# Patient Record
Sex: Female | Born: 2000 | Race: White | Hispanic: No | Smoking: Former smoker
Health system: Southern US, Community
[De-identification: ages and names within clinical notes are randomized; demographics above are authoritative.]

## PROBLEM LIST (undated history)

## (undated) DIAGNOSIS — F4310 Post-traumatic stress disorder, unspecified: Secondary | ICD-10-CM

## (undated) DIAGNOSIS — F419 Anxiety disorder, unspecified: Secondary | ICD-10-CM

## (undated) DIAGNOSIS — T1491XA Suicide attempt, initial encounter: Secondary | ICD-10-CM

## (undated) DIAGNOSIS — D509 Iron deficiency anemia, unspecified: Secondary | ICD-10-CM

## (undated) DIAGNOSIS — F4010 Social phobia, unspecified: Secondary | ICD-10-CM

## (undated) DIAGNOSIS — F509 Eating disorder, unspecified: Secondary | ICD-10-CM

## (undated) DIAGNOSIS — F32A Depression, unspecified: Secondary | ICD-10-CM

## (undated) DIAGNOSIS — K219 Gastro-esophageal reflux disease without esophagitis: Secondary | ICD-10-CM

## (undated) DIAGNOSIS — F609 Personality disorder, unspecified: Secondary | ICD-10-CM

## (undated) DIAGNOSIS — F319 Bipolar disorder, unspecified: Secondary | ICD-10-CM

## (undated) HISTORY — DX: Depression, unspecified: F32.A

---
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
## 2019-01-22 ENCOUNTER — Other Ambulatory Visit: Payer: Self-pay

## 2019-01-22 ENCOUNTER — Encounter: Payer: Self-pay | Admitting: Emergency Medicine

## 2019-01-22 ENCOUNTER — Emergency Department
Admission: EM | Admit: 2019-01-22 | Discharge: 2019-01-22 | Disposition: A | Discharge status: Home / Self Care | Payer: Self-pay | Attending: Emergency Medicine | Admitting: Emergency Medicine

## 2019-01-22 DIAGNOSIS — R509 Fever, unspecified: Secondary | ICD-10-CM | POA: Insufficient documentation

## 2019-01-22 DIAGNOSIS — Z5321 Procedure and treatment not carried out due to patient leaving prior to being seen by health care provider: Secondary | ICD-10-CM | POA: Insufficient documentation

## 2019-01-22 LAB — URINALYSIS, COMPLETE (UACMP) WITH MICROSCOPIC
Bilirubin Urine: NEGATIVE
Glucose, UA: NEGATIVE mg/dL
Ketones, ur: NEGATIVE mg/dL
Nitrite: POSITIVE — AB
Protein, ur: 100 mg/dL — AB
Specific Gravity, Urine: 1.015 (ref 1.005–1.030)
WBC, UA: >50 WBC/hpf — ABNORMAL HIGH (ref 0–5)
pH: 8 (ref 5.0–8.0)

## 2019-01-22 LAB — CBC
HCT: 40 % (ref 36.0–46.0)
Hemoglobin: 13.4 g/dL (ref 12.0–15.0)
MCH: 29.8 pg (ref 26.0–34.0)
MCHC: 33.5 g/dL (ref 30.0–36.0)
MCV: 89.1 fL (ref 80.0–100.0)
Platelets: 172 10*3/uL (ref 150–400)
RBC: 4.49 MIL/uL (ref 3.87–5.11)
RDW: 11.9 % (ref 11.5–15.5)
WBC: 10.5 10*3/uL (ref 4.0–10.5)
nRBC: 0 % (ref 0.0–0.2)

## 2019-01-22 LAB — BASIC METABOLIC PANEL
Anion gap: 8 (ref 5–15)
BUN: 9 mg/dL (ref 6–20)
CO2: 22 mmol/L (ref 22–32)
Calcium: 9.4 mg/dL (ref 8.9–10.3)
Chloride: 105 mmol/L (ref 98–111)
Creatinine, Ser: 0.71 mg/dL (ref 0.44–1.00)
GFR calc Af Amer: >60 mL/min (ref >60)
GFR calc non Af Amer: >60 mL/min (ref >60)
Glucose, Bld: 103 mg/dL — ABNORMAL HIGH (ref 70–99)
Potassium: 3.4 mmol/L — ABNORMAL LOW (ref 3.5–5.1)
Sodium: 135 mmol/L (ref 135–145)

## 2019-01-22 LAB — POCT PREGNANCY, URINE: Preg Test, Ur: NEGATIVE

## 2019-01-22 NOTE — ED Notes (Signed)
Called for patient to re-check vitals.  No response.

## 2019-01-22 NOTE — ED Triage Notes (Signed)
Patient presents to the ED with right sided flank pain that began yesterday.  Patient reports vomiting x 4 today.  Denies diarrhea.  Patient also reports fever of 101 this morning.

## 2019-01-22 NOTE — ED Notes (Signed)
Patient called to re-check.  No response.  Attempted to call patient's cell phone.  The person who answered stated patient no longer lives at residence.

## 2019-01-25 ENCOUNTER — Telehealth: Payer: Self-pay | Admitting: Emergency Medicine

## 2019-01-25 NOTE — Telephone Encounter (Signed)
Called patient due to lwot to inquire about condition and follow up plans.  Called home number--[facility] --per triage note-pt does not live there.  Called parents number and they said they have no contact with patient.

## 2019-01-29 ENCOUNTER — Emergency Department
Admission: EM | Admit: 2019-01-29 | Discharge: 2019-01-29 | Disposition: A | Discharge status: Home / Self Care | Payer: Self-pay | Attending: Student in an Organized Health Care Education/Training Program | Admitting: Student in an Organized Health Care Education/Training Program

## 2019-01-29 ENCOUNTER — Other Ambulatory Visit: Payer: Self-pay

## 2019-01-29 ENCOUNTER — Encounter: Payer: Self-pay | Admitting: Intensive Care

## 2019-01-29 DIAGNOSIS — F17210 Nicotine dependence, cigarettes, uncomplicated: Secondary | ICD-10-CM | POA: Insufficient documentation

## 2019-01-29 DIAGNOSIS — X58XXXA Exposure to other specified factors, initial encounter: Secondary | ICD-10-CM | POA: Insufficient documentation

## 2019-01-29 DIAGNOSIS — Z1881 Retained glass fragments: Secondary | ICD-10-CM | POA: Insufficient documentation

## 2019-01-29 DIAGNOSIS — T148XXA Other injury of unspecified body region, initial encounter: Secondary | ICD-10-CM

## 2019-01-29 DIAGNOSIS — S80851A Superficial foreign body, right lower leg, initial encounter: Secondary | ICD-10-CM | POA: Insufficient documentation

## 2019-01-29 DIAGNOSIS — Y929 Unspecified place or not applicable: Secondary | ICD-10-CM | POA: Insufficient documentation

## 2019-01-29 DIAGNOSIS — Y999 Unspecified external cause status: Secondary | ICD-10-CM | POA: Insufficient documentation

## 2019-01-29 DIAGNOSIS — Y998 Other external cause status: Secondary | ICD-10-CM | POA: Insufficient documentation

## 2019-01-29 DIAGNOSIS — Z79899 Other long term (current) drug therapy: Secondary | ICD-10-CM | POA: Insufficient documentation

## 2019-01-29 DIAGNOSIS — Y939 Activity, unspecified: Secondary | ICD-10-CM | POA: Insufficient documentation

## 2019-01-29 MED ORDER — BACITRACIN-NEOMYCIN-POLYMYXIN 400-5-5000 EX OINT
TOPICAL_OINTMENT | CUTANEOUS | Status: AC
  Administered 2019-01-29: 1 via TOPICAL
  Filled 2019-01-29: qty 1

## 2019-01-29 MED ORDER — LIDOCAINE HCL (PF) 1 % IJ SOLN
5.0000 mL | Freq: Once | INTRAMUSCULAR | Status: DC
  Filled 2019-01-29: qty 5

## 2019-01-29 MED ORDER — BACITRACIN-NEOMYCIN-POLYMYXIN OINTMENT TUBE
TOPICAL_OINTMENT | Freq: Once | CUTANEOUS | Status: AC
  Administered 2019-01-29: 1 via TOPICAL
  Filled 2019-01-29: qty 14.17

## 2019-01-29 NOTE — Discharge Instructions (Signed)
Do not get the sutured area wet for 24 hours. After 24 hours, shower/bathe as usual and pat the area dry. Change the bandage 2 times per day and apply antibiotic ointment. Leave open to air when at no risk of getting the area dirty, but cover at night before bed. See your PCP or go to Urgent Care in 7 days for suture removal or sooner for signs or concern of infection.  

## 2019-01-29 NOTE — ED Triage Notes (Signed)
Patient presents with having glass in her right calf. She reports it has been in there for two years and now is moving and trying to poke through skin. She tried removing at home but was unsuccessful

## 2019-01-31 NOTE — ED Provider Notes (Signed)
[facility] Emergency Department Provider Note  ____________________________________________  Time seen: Approximately 8:00 PM  I have reviewed the triage vital signs and the nursing notes.   HISTORY  Chief Complaint Foreign Body in Skin (right calf)   HPI Jacqueline Whitehead is a 18 y.o. female who presents to the emergency department for evaluation of glass foreign body in the right calf that has been present for the past 2 years. She states that over the last several days, it has moved up toward the surface of the skin. She has attempted to remove it at home without success. Glass was from a broken window.   Past Medical History:  Diagnosis Date  . Anxiety   . Bipolar 1 disorder (HCC)   . Eating disorder   . Personality disorder (HCC)   . PTSD (post-traumatic stress disorder)   . Suicide attempt [facility](HCC)     Patient Active Problem List   Diagnosis Date Noted  . 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 Suicidal ideation 06/30/2017  . MDD (major depressive disorder) 06/30/2017    History reviewed. No pertinent surgical history.  Prior to Admission medications   Medication Sig Start Date End Date Taking? Authorizing Provider  cetirizine (ZYRTEC) 10 MG tablet Take 10 mg by mouth at bedtime.    [provider]  Cholecalciferol (VITAMIN D) 2000 units tablet Take 2,000 Units by mouth daily.    [provider]  clindamycin-benzoyl peroxide (BENZACLIN) gel Apply 1 application topically at bedtime.    [provider]  feeding supplement, ENSURE ENLIVE, (ENSURE ENLIVE) LIQD Take 237 mLs by mouth 2 (two) times daily between meals. 07/02/17   Denzil Magnusonhomas, [place]ash[place], NP  haloperidol (HALDOL) 5 MG tablet Take 5 mg by mouth every 4 (four) hours as needed for agitation.    [provider]  haloperidol lactate (HALDOL) 5 MG/ML injection Inject 5 mg into the muscle every 4 (four) hours as needed  (for agitation).    [provider]  lamoTRIgine (LAMICTAL) 100 MG tablet Take 100 mg by mouth at bedtime.    [provider]  lithium carbonate 150 MG capsule Take 3 capsules (450 mg total) by mouth 2 (two) times daily with a meal. 07/01/17   Denzil Magnusonhomas, [place]ash[place], NP  LORazepam (ATIVAN) 2 MG tablet Take 2 mg by mouth every 4 (four) hours as needed for anxiety.    [provider]  LORazepam (ATIVAN) 2 MG/ML injection Inject 2 mg into the vein every 4 (four) hours as needed for anxiety.    [provider]  lurasidone (LATUDA) 80 MG TABS tablet Take 80 mg by mouth at bedtime.    [provider]  methylphenidate 36 MG PO CR tablet Take 36 mg by mouth daily.    [provider]  mirtazapine (REMERON) 30 MG tablet Take 30 mg by mouth at bedtime.    [provider]  polyethylene glycol (MIRALAX / GLYCOLAX) packet Take 17 g by mouth daily as needed for mild constipation.    [provider]  prazosin (MINIPRESS) 2 MG capsule Take 1 capsule (2 mg total) by mouth at bedtime. 07/01/17   Denzil Magnusonhomas, Lash[place], NP  topiramate (TOPAMAX) 50 MG tablet Take 50 mg by mouth 2 (two) times daily.    [provider]  vitamin C (ASCORBIC ACID) 500 MG tablet Take 500 mg by mouth daily.    [provider]    Allergies Kiwi extract; Bactrim [sulfamethoxazole-trimethoprim]; Peach [prunus persica]; and  Adhesive [tape]  History reviewed. No pertinent family history.  Social History Social History   Tobacco Use  . Smoking status: Current Every Day Smoker    Types: Cigarettes  . Smokeless tobacco: Never Used  Substance Use Topics  . Alcohol use: Yes  . Drug use: Yes    Types: Marijuana    Review of Systems  Constitutional: Negative for fever. Respiratory: Negative for cough or shortness of breath.  Musculoskeletal: Negative for myalgias Skin: Positive for foreign body. Neurological: Negative for numbness or  paresthesias. ____________________________________________   PHYSICAL EXAM:  VITAL SIGNS: ED Triage Vitals [01/29/19 1842]  Enc Vitals Group     BP (!) 131/91     Pulse Rate 85     Resp 16     Temp 98.4 F (36.9 C)     Temp Source Oral     SpO2 97 %     Weight 155 lb (70.3 kg)     Height 5\' 4"  (1.626 m)     Head Circumference      Peak Flow      Pain Score 8     Pain Loc      Pain Edu?      Excl. in GC?      Constitutional: Well appearing. Eyes: Conjunctivae are clear without discharge or drainage. Nose: No rhinorrhea noted. Mouth/Throat: Airway is patent.  Neck: No stridor. Unrestricted range of motion observed. Cardiovascular: Capillary refill is <3 seconds.  Respiratory: Respirations are even and unlabored.. Musculoskeletal: Unrestricted range of motion observed. Neurologic: Awake, alert, and oriented x 4.  Skin:  Palpable foreign body in the medial aspect of the right calf with abrasion overlying the area.  ____________________________________________   LABS (all labs ordered are listed, but only abnormal results are displayed)  Labs Reviewed - No data to display ____________________________________________  EKG  Not indicated. ____________________________________________  RADIOLOGY  Bedside US performed by me confirms small foreign body. ____________________________________________   PROCEDURES  .Foreign Body Removal Date/Time: 01/31/2019 7:26 AM Performed by: Chinita Pester, FNP Authorized by: Chinita Pester, FNP  Consent: Verbal consent obtained. Consent given by: patient Patient understanding: patient states understanding of the procedure being performed Imaging studies: imaging studies available Patient identity confirmed: verbally with patient Body area: skin General location: lower extremity Location details: right lower leg Anesthesia: local infiltration  Anesthesia: Local Anesthetic: lidocaine 1% without epinephrine Anesthetic  total: 5 mL Localization method: ultrasound and probed Removal mechanism: forceps Dressing: antibiotic ointment and dressing applied Depth: subcutaneous Complexity: simple 1 objects recovered. Objects recovered: glass Post-procedure assessment: foreign body removed Patient tolerance: Patient tolerated the procedure well with no immediate complications Comments: After cleansing skin with Betadine, 1cm incision made in the area under sterile technique in order to remove foreign body.  (2) 5-0 Nylon simple interrupted sutures were inserted post removal.    ____________________________________________   INITIAL IMPRESSION / ASSESSMENT AND PLAN / ED COURSE  Jacqueline Whitehead is a 18 y.o. female who presents to the emergency department for treatment and evaluation of foreign body in the right calf. See above for procedure details. She is to go to urgent care for suture removal in 7-10 days. Wound care instructions given. She is to see PCP, urgent care, or return here for symptoms of concern.  Medications  neomycin-bacitracin-polymyxin (NEOSPORIN) ointment (1 application Topical Given 01/29/19 2131)     Pertinent labs & imaging results that were available during my care of the patient were reviewed by me and considered in  my medical decision making (see chart for details).  ____________________________________________   FINAL CLINICAL IMPRESSION(S) / ED DIAGNOSES  Final diagnoses:  Foreign body in skin    ED Discharge Orders    None       Note:  This document was prepared using Dragon voice recognition software and may include unintentional dictation errors.   Chinita Pester, FNP 01/31/19 Jesusita Oka    Willy Eddy, MD 02/03/19 [phone]

## 2019-02-19 ENCOUNTER — Encounter: Payer: Self-pay | Admitting: Emergency Medicine

## 2019-02-19 ENCOUNTER — Emergency Department
Admission: EM | Admit: 2019-02-19 | Discharge: 2019-02-19 | Disposition: A | Discharge status: Home / Self Care | Payer: Self-pay | Attending: Emergency Medicine | Admitting: Emergency Medicine

## 2019-02-19 ENCOUNTER — Emergency Department: Payer: Self-pay

## 2019-02-19 ENCOUNTER — Other Ambulatory Visit: Payer: Self-pay

## 2019-02-19 DIAGNOSIS — F17210 Nicotine dependence, cigarettes, uncomplicated: Secondary | ICD-10-CM | POA: Insufficient documentation

## 2019-02-19 DIAGNOSIS — Z79899 Other long term (current) drug therapy: Secondary | ICD-10-CM | POA: Insufficient documentation

## 2019-02-19 DIAGNOSIS — N12 Tubulo-interstitial nephritis, not specified as acute or chronic: Secondary | ICD-10-CM | POA: Insufficient documentation

## 2019-02-19 LAB — COMPREHENSIVE METABOLIC PANEL
ALT: 10 U/L (ref 0–44)
AST: 13 U/L — ABNORMAL LOW (ref 15–41)
Albumin: 3.9 g/dL (ref 3.5–5.0)
Alkaline Phosphatase: 51 U/L (ref 38–126)
Anion gap: 9 (ref 5–15)
BUN: 5 mg/dL — ABNORMAL LOW (ref 6–20)
CO2: 23 mmol/L (ref 22–32)
Calcium: 9.2 mg/dL (ref 8.9–10.3)
Chloride: 102 mmol/L (ref 98–111)
Creatinine, Ser: 0.78 mg/dL (ref 0.44–1.00)
GFR calc Af Amer: >60 mL/min (ref >60)
GFR calc non Af Amer: >60 mL/min (ref >60)
Glucose, Bld: 101 mg/dL — ABNORMAL HIGH (ref 70–99)
Potassium: 3.7 mmol/L (ref 3.5–5.1)
Sodium: 134 mmol/L — ABNORMAL LOW (ref 135–145)
Total Bilirubin: 1.1 mg/dL (ref 0.3–1.2)
Total Protein: 8 g/dL (ref 6.5–8.1)

## 2019-02-19 LAB — CBC WITH DIFFERENTIAL/PLATELET
Abs Immature Granulocytes: 0.04 10*3/uL (ref 0.00–0.07)
Basophils Absolute: 0 10*3/uL (ref 0.0–0.1)
Basophils Relative: 0 %
Eosinophils Absolute: 0 10*3/uL (ref 0.0–0.5)
Eosinophils Relative: 0 %
HCT: 33.8 % — ABNORMAL LOW (ref 36.0–46.0)
Hemoglobin: 11.3 g/dL — ABNORMAL LOW (ref 12.0–15.0)
Immature Granulocytes: 1 %
Lymphocytes Relative: 15 %
Lymphs Abs: 1.3 10*3/uL (ref 0.7–4.0)
MCH: 28.7 pg (ref 26.0–34.0)
MCHC: 33.4 g/dL (ref 30.0–36.0)
MCV: 85.8 fL (ref 80.0–100.0)
Monocytes Absolute: 0.7 10*3/uL (ref 0.1–1.0)
Monocytes Relative: 8 %
Neutro Abs: 6.4 10*3/uL (ref 1.7–7.7)
Neutrophils Relative %: 76 %
Platelets: 215 10*3/uL (ref 150–400)
RBC: 3.94 MIL/uL (ref 3.87–5.11)
RDW: 12.4 % (ref 11.5–15.5)
WBC: 8.5 10*3/uL (ref 4.0–10.5)
nRBC: 0 % (ref 0.0–0.2)

## 2019-02-19 LAB — URINALYSIS, COMPLETE (UACMP) WITH MICROSCOPIC
Bilirubin Urine: NEGATIVE
Glucose, UA: NEGATIVE mg/dL
Ketones, ur: NEGATIVE mg/dL
Leukocytes,Ua: NEGATIVE
Nitrite: NEGATIVE
Protein, ur: NEGATIVE mg/dL
Specific Gravity, Urine: 1.012 (ref 1.005–1.030)
pH: 8 (ref 5.0–8.0)

## 2019-02-19 LAB — LIPASE, BLOOD: Lipase: 25 U/L (ref 11–51)

## 2019-02-19 LAB — LACTIC ACID, PLASMA: Lactic Acid, Venous: 0.8 mmol/L (ref 0.5–1.9)

## 2019-02-19 LAB — POCT PREGNANCY, URINE: Preg Test, Ur: NEGATIVE

## 2019-02-19 MED ORDER — CEPHALEXIN 500 MG PO CAPS: 500.0000 mg | ORAL_CAPSULE | Freq: Four times a day (QID) | ORAL | 0 refills | Status: AC

## 2019-02-19 MED ORDER — ONDANSETRON HCL 4 MG/2ML IJ SOLN
4.0000 mg | Freq: Once | INTRAMUSCULAR | Status: AC
  Administered 2019-02-19: 12:00:00 4 mg via INTRAVENOUS
  Filled 2019-02-19: qty 2

## 2019-02-19 MED ORDER — SODIUM CHLORIDE 0.9 % IV SOLN
2.0000 g | Freq: Once | INTRAVENOUS | Status: AC
  Administered 2019-02-19: 2 g via INTRAVENOUS
  Filled 2019-02-19 (×2): qty 20

## 2019-02-19 MED ORDER — MORPHINE SULFATE (PF) 4 MG/ML IV SOLN
4.0000 mg | Freq: Once | INTRAVENOUS | Status: AC
  Administered 2019-02-19: 12:00:00 4 mg via INTRAVENOUS
  Filled 2019-02-19: qty 1

## 2019-02-19 MED ORDER — ACETAMINOPHEN 500 MG PO TABS
1000.0000 mg | ORAL_TABLET | Freq: Once | ORAL | Status: AC
  Administered 2019-02-19: 12:00:00 1000 mg via ORAL
  Filled 2019-02-19: qty 2

## 2019-02-19 MED ORDER — PROMETHAZINE HCL 25 MG PO TABS: 25.0000 mg | ORAL_TABLET | Freq: Four times a day (QID) | ORAL | 0 refills | Status: DC | PRN

## 2019-02-19 MED ORDER — KETOROLAC TROMETHAMINE 30 MG/ML IJ SOLN
15.0000 mg | Freq: Once | INTRAMUSCULAR | Status: AC
  Administered 2019-02-19: 15 mg via INTRAVENOUS

## 2019-02-19 MED ORDER — SODIUM CHLORIDE 0.9 % IV BOLUS
1000.0000 mL | Freq: Once | INTRAVENOUS | Status: AC
  Administered 2019-02-19: 12:00:00 1000 mL via INTRAVENOUS

## 2019-02-19 MED ORDER — IOHEXOL 300 MG/ML  SOLN
100.0000 mL | Freq: Once | INTRAMUSCULAR | Status: AC | PRN
  Administered 2019-02-19: 100 mL via INTRAVENOUS
  Filled 2019-02-19: qty 100

## 2019-02-19 MED ORDER — KETOROLAC TROMETHAMINE 15 MG/ML IJ SOLN
15.0000 mg | Freq: Once | INTRAMUSCULAR | Status: DC
  Filled 2019-02-19: qty 1

## 2019-02-19 NOTE — ED Triage Notes (Signed)
Patient presents to the ED with right flank pain x 1 month.  Patient states flank pain has been constant for the past 2 weeks.  Patient states she had a fever approx. 1 week ago.  Patient denies any vomiting in the past 24 hours but reports occasional vomiting.  Patient reports some discomfort with urination.

## 2019-02-19 NOTE — ED Provider Notes (Signed)
[facility] Emergency Department Provider Note  ____________________________________________   First MD Initiated Contact with Patient 02/19/19 1107     (approximate)  I have reviewed the triage vital signs and the nursing notes.   HISTORY  Chief Complaint Flank Pain    HPI Jacqueline Whitehead is a 18 y.o. female with past medical history as below here with severe right-sided abdominal pain.  The patient states her symptoms started approximately 1 month ago.  She states at that time, she developed an aching, gnawing, right flank pain.  She had some associated foul-smelling urine.  She states that the symptoms initially resolved, but over the last week, have returned.  She describes it as an aching, cramping, severe, right flank pain with significant nausea.  She has had persistent vomiting as well.  She states the pain is worse with any kind of movement or palpation.  No alleviating factors.  She strongly denies any vaginal bleeding or discharge.  She has some intermittent vaginal spotting from her Implanon, but states this is been constant since it was placed.  No diarrhea.  No constipation.   Of note, patient has a history of recurrent UTIs.  No known history of kidney stones.       Past Medical History:  Diagnosis Date  . Anxiety   . Bipolar 1 disorder ([place])   . Eating disorder   . Personality disorder ([place])   . PTSD (post-traumatic stress disorder)   . Suicide attempt [facility])     Patient Active Problem List   Diagnosis Date Noted  . 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 Suicidal ideation 06/30/2017  . MDD (major depressive disorder) 06/30/2017    History reviewed. No pertinent surgical history.  Prior to Admission medications   Medication Sig Start Date End Date Taking? Authorizing Provider  cephALEXin (KEFLEX) 500 MG capsule Take 1 capsule (500 mg total) by mouth 4 (four) times daily for 10 days.  02/19/19 03/01/19  Duffy Bruce, MD  cetirizine (ZYRTEC) 10 MG tablet Take 10 mg by mouth at bedtime.    [provider]  Cholecalciferol (VITAMIN D) 2000 units tablet Take 2,000 Units by mouth daily.    [provider]  clindamycin-benzoyl peroxide (BENZACLIN) gel Apply 1 application topically at bedtime.    [provider]  feeding supplement, ENSURE ENLIVE, (ENSURE ENLIVE) LIQD Take 237 mLs by mouth 2 (two) times daily between meals. 07/02/17   Mordecai Maes, NP  haloperidol (HALDOL) 5 MG tablet Take 5 mg by mouth every 4 (four) hours as needed for agitation.    [provider]  haloperidol lactate (HALDOL) 5 MG/ML injection Inject 5 mg into the muscle every 4 (four) hours as needed (for agitation).    [provider]  lamoTRIgine (LAMICTAL) 100 MG tablet Take 100 mg by mouth at bedtime.    [provider]  lithium carbonate 150 MG capsule Take 3 capsules (450 mg total) by mouth 2 (two) times daily with a meal. 07/01/17   Mordecai Maes, NP  LORazepam (ATIVAN) 2 MG tablet Take 2 mg by mouth every 4 (four) hours as needed for anxiety.    [provider]  LORazepam (ATIVAN) 2 MG/ML injection Inject 2 mg into the vein every 4 (four) hours as needed for anxiety.    [provider]  lurasidone (LATUDA) 80 MG TABS tablet Take 80 mg by mouth at bedtime.    [provider]  methylphenidate 36 MG PO  CR tablet Take 36 mg by mouth daily.    [provider]  mirtazapine (REMERON) 30 MG tablet Take 30 mg by mouth at bedtime.    [provider]  polyethylene glycol (MIRALAX / GLYCOLAX) packet Take 17 g by mouth daily as needed for mild constipation.    [provider]  prazosin (MINIPRESS) 2 MG capsule Take 1 capsule (2 mg total) by mouth at bedtime. 07/01/17   Denzil Magnusonhomas, [place], NP  promethazine (PHENERGAN) 25 MG tablet Take 1 tablet (25 mg total) by mouth every 6 (six) hours as needed for nausea or  vomiting. 02/19/19   Shaune PollackIsaacs, Keara Pagliarulo, MD  topiramate (TOPAMAX) 50 MG tablet Take 50 mg by mouth 2 (two) times daily.    [provider]  vitamin C (ASCORBIC ACID) 500 MG tablet Take 500 mg by mouth daily.    [provider]    Allergies Kiwi extract, Bactrim [sulfamethoxazole-trimethoprim], Peach [prunus persica], and Adhesive [tape]  No family history on file.  Social History Social History   Tobacco Use  . Smoking status: Current Every Day Smoker    Types: Cigarettes  . Smokeless tobacco: Never Used  Substance Use Topics  . Alcohol use: Yes  . Drug use: Yes    Types: Marijuana    Review of Systems Review of Systems  Constitutional: Positive for chills, fatigue and fever.  HENT: Negative for congestion and rhinorrhea.   Eyes: Negative for visual disturbance.  Respiratory: Negative for cough and shortness of breath.   Cardiovascular: Negative for chest pain.  Gastrointestinal: Positive for abdominal pain, nausea and vomiting.  Genitourinary: Positive for dysuria and frequency.  Musculoskeletal: Negative for neck pain.  Skin: Negative for rash.  Neurological: Positive for weakness.  All other systems reviewed and are negative.    ____________________________________________  PHYSICAL EXAM:  VITAL SIGNS: ED Triage Vitals  Enc Vitals Group     BP 02/19/19 1054 128/80     Pulse Rate 02/19/19 1054 (!) 114     Resp 02/19/19 1054 20     Temp 02/19/19 1054 (!) 101 F (38.3 C)     Temp Source 02/19/19 1054 Oral     SpO2 02/19/19 1054 99 %     Weight 02/19/19 1055 150 lb (68 kg)     Height 02/19/19 1055 5\' 5"  (1.651 m)     Head Circumference --      Peak Flow --      Pain Score 02/19/19 1055 9     Pain Loc --      Pain Edu? --      Excl. in GC? --     Physical Exam Vitals signs and nursing note reviewed.  Constitutional:      General: She is not in acute distress.    Appearance: She is well-developed.  HENT:     Head: Normocephalic and  atraumatic.     Mouth/Throat:     Mouth: Mucous membranes are dry.  Eyes:     Conjunctiva/sclera: Conjunctivae normal.  Neck:     Musculoskeletal: Neck supple.  Cardiovascular:     Rate and Rhythm: Regular rhythm. Tachycardia present.     Heart sounds: Normal heart sounds. No murmur. No friction rub.  Pulmonary:     Effort: Pulmonary effort is normal. No respiratory distress.     Breath sounds: Normal breath sounds. No wheezing or rales.  Abdominal:     General: Bowel sounds are normal. There is no distension.     Palpations: Abdomen  is soft.     Tenderness: There is abdominal tenderness in the right upper quadrant, right lower quadrant, periumbilical area and left lower quadrant. There is right CVA tenderness. Positive signs include McBurney's sign. Negative signs include Murphy's sign and Rovsing's sign.  Skin:    General: Skin is warm.     Capillary Refill: Capillary refill takes less than 2 seconds.  Neurological:     Mental Status: She is alert and oriented to person, place, and time.     Motor: No abnormal muscle tone.       ____________________________________________   LABS (all labs ordered are listed, but only abnormal results are displayed)  Labs Reviewed  CBC WITH DIFFERENTIAL/PLATELET - Abnormal; Notable for the following components:      Result Value   Hemoglobin 11.3 (*)    HCT 33.8 (*)    All other components within normal limits  COMPREHENSIVE METABOLIC PANEL - Abnormal; Notable for the following components:   Sodium 134 (*)    Glucose, Bld 101 (*)    BUN 5 (*)    AST 13 (*)    All other components within normal limits  URINALYSIS, COMPLETE (UACMP) WITH MICROSCOPIC - Abnormal; Notable for the following components:   Color, Urine YELLOW (*)    APPearance HAZY (*)    Hgb urine dipstick SMALL (*)    Bacteria, UA RARE (*)    All other components within normal limits  URINE CULTURE  LIPASE, BLOOD  LACTIC ACID, PLASMA  POC URINE PREG, ED  POCT  PREGNANCY, URINE    ____________________________________________  EKG: N/A ________________________________________  RADIOLOGY All imaging, including plain films, CT scans, and ultrasounds, independently reviewed by me, and interpretations confirmed via formal radiology reads.  ED MD interpretation:   -CT A/P: No apparent appendicitis or obstruction; stranding noted b/l kidneys consistent with pyelonephritis. No appreciable abscess. No apparent stone or hydro.  Official radiology report(s): Ct Abdomen Pelvis W Contrast  Result Date: 02/19/2019 CLINICAL DATA:  Right flank pain over the last month, worsening recently. Intermittent fever. EXAM: CT ABDOMEN AND PELVIS WITH CONTRAST TECHNIQUE: Multidetector CT imaging of the abdomen and pelvis was performed using the standard protocol following bolus administration of intravenous contrast. CONTRAST:  100mL OMNIPAQUE IOHEXOL 300 MG/ML  SOLN COMPARISON:  None. FINDINGS: Lower chest: Normal Hepatobiliary: Normal Pancreas: Normal Spleen: Normal Adrenals/Urinary Tract: Adrenal glands are normal. There are multiple foci of poorly functioning renal tissue on both sides, right more than left, consistent with pyelonephritis/lobar nephronia. On each side, there is a 1 cm low-density presumed represent a cyst. Early renal abscess can not be ruled out but is less likely. No hydronephrosis. No bladder abnormality. Stomach/Bowel: Normal Vascular/Lymphatic: Normal Reproductive: Normal Other: None Musculoskeletal: Normal IMPRESSION: Findings consistent with pyelonephritis/lobar nephronia. This is slightly more extensive on the right than on the left. 1 cm low-density foci in each kidney probably representing incidental cysts. No renal obstruction. No stone disease. Electronically Signed   By: Paulina FusiMark  Shogry M.D.   On: 02/19/2019 12:40    ____________________________________________  PROCEDURES   Procedure(s) performed (including Critical Care):  Procedures   ____________________________________________  INITIAL IMPRESSION / MDM / ASSESSMENT AND PLAN / ED COURSE  As part of my medical decision making, I reviewed the following data within the electronic MEDICAL RECORD NUMBER Notes from prior ED visits and [place] Controlled Substance Database      *Douglass RiversCheyenne Zarzycki was evaluated in Emergency Department on 02/19/2019 for the symptoms described in the history of present illness.  She was evaluated in the context of the global COVID-19 pandemic, which necessitated consideration that the patient might be at risk for infection with the SARS-CoV-2 virus that causes COVID-19. Institutional protocols and algorithms that pertain to the evaluation of patients at risk for COVID-19 are in a state of rapid change based on information released by regulatory bodies including the CDC and federal and state organizations. These policies and algorithms were followed during the patient's care in the ED.  Some ED evaluations and interventions may be delayed as a result of limited staffing during the pandemic.*   Clinical Course as of Feb 19 1856  Fri Feb 19, 2019  1130 18 yo F here with RLQ pain. DDx includes pyelo, UTI, appendicitis, less likely cholecystitis, infected ureteral stone. Will check CT given TTP, fever, guarding on exam with c/f appendicitis. IVF ordered.   [CI]  1130 Tylenol given  Temp(!): 101 F (38.3 C) [CI]  1131 Likely 2/2 fever, non toxic on exam  Pulse Rate(!): 114 [CI]  1609 18 yo F here with pyelo. LA normal, CBC reassuring, VS improved with tylenol, fluids. CT scan w/o complication or abscess. Rocephin, d/c.   [CI]    Clinical Course User Index [CI] Shaune PollackIsaacs, Glendon Fiser, MD    Medical Decision Making: 18 yo F with h/o recurrent UTI here with R>L abdominal pain, fever, n/v. Labs as above. Normal LA, CBC reassuring, no signs of severe sepsis. Imaging shows pyelo vs lobar nephronia, no obstruction or abscess. UCx sent, pt feels markedly improved w/ IVF and  Rocephin. VS are now normal. She is tolerating PO. Denies vaginal bleeding, discharge, or signs of PID. Will tx with keflex as she has no insurance, d/c with close outpt follow-up and good return precautions.  ____________________________________________  FINAL CLINICAL IMPRESSION(S) / ED DIAGNOSES  Final diagnoses:  Pyelonephritis     MEDICATIONS GIVEN DURING THIS VISIT:  Medications  sodium chloride 0.9 % bolus 1,000 mL (0 mLs Intravenous Stopped 02/19/19 1228)  morphine 4 MG/ML injection 4 mg (4 mg Intravenous Given 02/19/19 1153)  ondansetron (ZOFRAN) injection 4 mg (4 mg Intravenous Given 02/19/19 1153)  acetaminophen (TYLENOL) tablet 1,000 mg (1,000 mg Oral Given 02/19/19 1153)  iohexol (OMNIPAQUE) 300 MG/ML solution 100 mL (100 mLs Intravenous Contrast Given 02/19/19 1227)  cefTRIAXone (ROCEPHIN) 2 g in sodium chloride 0.9 % 100 mL IVPB (0 g Intravenous Stopped 02/19/19 1634)  ketorolac (TORADOL) 30 MG/ML injection 15 mg (15 mg Intravenous Given 02/19/19 1503)     ED Discharge Orders         Ordered    cephALEXin (KEFLEX) 500 MG capsule  4 times daily     02/19/19 1527    promethazine (PHENERGAN) 25 MG tablet  Every 6 hours PRN     02/19/19 1527           Note:  This document was prepared using Dragon voice recognition software and may include unintentional dictation errors.   Shaune PollackIsaacs, Adanely Reynoso, MD 02/19/19 [phone]

## 2019-02-19 NOTE — Discharge Instructions (Addendum)
Your workup today suggests that you have a urinary tract infection (UTI) which has spread to your kidneys.  Please take your antibiotic as prescribed and over-the-counter pain medication (Tylenol or Motrin) as needed, but no more than recommended on the label instructions.  Drink PLENTY of fluids.  Call your regular doctor to schedule the next available appointment to follow up on today's ED visit, or return immediately to the ED if your pain worsens, you have decreased urine production, develop fever, persistent vomiting, or other symptoms that concern you.  

## 2019-02-21 LAB — URINE CULTURE
Culture: 50000 — AB
Special Requests: NORMAL

## 2019-03-14 ENCOUNTER — Other Ambulatory Visit: Payer: Self-pay

## 2019-03-14 ENCOUNTER — Encounter: Payer: Self-pay | Admitting: Emergency Medicine

## 2019-03-14 ENCOUNTER — Emergency Department
Admission: EM | Admit: 2019-03-14 | Discharge: 2019-03-14 | Disposition: A | Discharge status: Home / Self Care | Payer: Self-pay | Attending: Emergency Medicine | Admitting: Emergency Medicine

## 2019-03-14 ENCOUNTER — Emergency Department: Payer: Self-pay

## 2019-03-14 DIAGNOSIS — F17210 Nicotine dependence, cigarettes, uncomplicated: Secondary | ICD-10-CM | POA: Insufficient documentation

## 2019-03-14 DIAGNOSIS — R1032 Left lower quadrant pain: Secondary | ICD-10-CM | POA: Insufficient documentation

## 2019-03-14 DIAGNOSIS — R1031 Right lower quadrant pain: Secondary | ICD-10-CM | POA: Insufficient documentation

## 2019-03-14 DIAGNOSIS — R109 Unspecified abdominal pain: Secondary | ICD-10-CM

## 2019-03-14 DIAGNOSIS — Z79899 Other long term (current) drug therapy: Secondary | ICD-10-CM | POA: Insufficient documentation

## 2019-03-14 LAB — URINALYSIS, COMPLETE (UACMP) WITH MICROSCOPIC
Bilirubin Urine: NEGATIVE
Glucose, UA: NEGATIVE mg/dL
Hgb urine dipstick: NEGATIVE
Ketones, ur: NEGATIVE mg/dL
Leukocytes,Ua: NEGATIVE
Nitrite: NEGATIVE
Protein, ur: NEGATIVE mg/dL
Specific Gravity, Urine: 1.021 (ref 1.005–1.030)
pH: 6 (ref 5.0–8.0)

## 2019-03-14 LAB — BASIC METABOLIC PANEL
Anion gap: 6 (ref 5–15)
BUN: 12 mg/dL (ref 6–20)
CO2: 26 mmol/L (ref 22–32)
Calcium: 9.5 mg/dL (ref 8.9–10.3)
Chloride: 108 mmol/L (ref 98–111)
Creatinine, Ser: 0.69 mg/dL (ref 0.44–1.00)
GFR calc Af Amer: >60 mL/min (ref >60)
GFR calc non Af Amer: >60 mL/min (ref >60)
Glucose, Bld: 91 mg/dL (ref 70–99)
Potassium: 3.5 mmol/L (ref 3.5–5.1)
Sodium: 140 mmol/L (ref 135–145)

## 2019-03-14 LAB — CBC
HCT: 40.6 % (ref 36.0–46.0)
Hemoglobin: 13.1 g/dL (ref 12.0–15.0)
MCH: 28.5 pg (ref 26.0–34.0)
MCHC: 32.3 g/dL (ref 30.0–36.0)
MCV: 88.5 fL (ref 80.0–100.0)
Platelets: 207 10*3/uL (ref 150–400)
RBC: 4.59 MIL/uL (ref 3.87–5.11)
RDW: 14.1 % (ref 11.5–15.5)
WBC: 5.2 10*3/uL (ref 4.0–10.5)
nRBC: 0 % (ref 0.0–0.2)

## 2019-03-14 LAB — URINE DRUG SCREEN, QUALITATIVE (ARMC ONLY)
Amphetamines, Ur Screen: NOT DETECTED
Barbiturates, Ur Screen: NOT DETECTED
Benzodiazepine, Ur Scrn: NOT DETECTED
Cannabinoid 50 Ng, Ur ~~LOC~~: POSITIVE — AB
Cocaine Metabolite,Ur ~~LOC~~: NOT DETECTED
MDMA (Ecstasy)Ur Screen: NOT DETECTED
Methadone Scn, Ur: NOT DETECTED
Opiate, Ur Screen: NOT DETECTED
Phencyclidine (PCP) Ur S: NOT DETECTED
Tricyclic, Ur Screen: NOT DETECTED

## 2019-03-14 LAB — PREGNANCY, URINE: Preg Test, Ur: NEGATIVE

## 2019-03-14 MED ORDER — SODIUM CHLORIDE 0.9 % IV SOLN
1.0000 g | Freq: Once | INTRAVENOUS | Status: AC
  Administered 2019-03-14: 20:00:00 1 g via INTRAVENOUS
  Filled 2019-03-14: qty 10

## 2019-03-14 MED ORDER — KETOROLAC TROMETHAMINE 30 MG/ML IJ SOLN
15.0000 mg | Freq: Once | INTRAMUSCULAR | Status: AC
  Administered 2019-03-14: 15 mg via INTRAVENOUS

## 2019-03-14 MED ORDER — KETOROLAC TROMETHAMINE 30 MG/ML IJ SOLN
INTRAMUSCULAR | Status: AC
  Filled 2019-03-14: qty 1

## 2019-03-14 MED ORDER — CEPHALEXIN 500 MG PO CAPS: 500.0000 mg | ORAL_CAPSULE | Freq: Three times a day (TID) | ORAL | 0 refills | Status: AC

## 2019-03-14 MED ORDER — ONDANSETRON HCL 4 MG PO TABS: 4.0000 mg | ORAL_TABLET | Freq: Three times a day (TID) | ORAL | 0 refills | Status: DC | PRN

## 2019-03-14 NOTE — ED Provider Notes (Addendum)
[facility] Emergency Department Provider Note  ____________________________________________   I have reviewed the triage vital signs and the nursing notes. Where available I have reviewed prior notes and, if possible and indicated, outside hospital notes.    HISTORY  Chief Complaint Flank Pain    HPI Jacqueline Whitehead is a 18 y.o. female with a history of personality disorder PTSD, bipolar, major depressive disorder, and frequent recurrent urinary tract infections who is here last month for UTI.  She states that since she was about 18 years old she said chronic recurrent pain in her side which comes and goes every few weeks.  Often's associate with urinary tract infection sometimes is just there.  Sometimes she has vomiting sometimes she does not.  She was seen here in June of this year, and had a urinary tract infection although her UA was not impressive for urine culture did grow out pansensitive E. coli, patient had a CT scan that showed questionable early pyelonephritis.  She was given a dose of Rocephin here in antibiotics were sent to a pharmacy for her but she no longer uses that pharmacy and did not get the antibiotics filled.  He states off and on since that time as is her general condition, she has had intermittent right-sided flank pain.  She had some pain over the last week or so again.  Never completely goes away but sometimes he goes up and down.  She does not had any vomiting recently although she did vomit a couple days ago and again this is not unusual for her since she was 18 years old.  No fevers.  She denies dysuria or urinary frequency.  She denies STI symptoms.  She has no abdominal pain is only flank pain.  This is her chronic presentation.  She has had a cystoscopy for this and 2011 she states.  She states that she needs a work note.  Pain is sharp, nonradiating waxing and waning, and feels like she is got another urinary tract infection.  States she is  on the Norplant on and has recurrent menstrual periods which are irregular.  She denies pregnancy.  Past Medical History:  Diagnosis Date  . Anxiety   . Bipolar 1 disorder (HCC)   . Eating disorder   . Personality disorder (HCC)   . PTSD (post-traumatic stress disorder)   . Suicide attempt [facility](HCC)     Patient Active Problem List   Diagnosis Date Noted  . 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 Suicidal ideation 06/30/2017  . MDD (major depressive disorder) 06/30/2017    History reviewed. No pertinent surgical history.  Prior to Admission medications   Medication Sig Start Date End Date Taking? Authorizing Provider  cetirizine (ZYRTEC) 10 MG tablet Take 10 mg by mouth at bedtime.    [provider]  Cholecalciferol (VITAMIN D) 2000 units tablet Take 2,000 Units by mouth daily.    [provider]  clindamycin-benzoyl peroxide (BENZACLIN) gel Apply 1 application topically at bedtime.    [provider]  feeding supplement, ENSURE ENLIVE, (ENSURE ENLIVE) LIQD Take 237 mLs by mouth 2 (two) times daily between meals. 07/02/17   Denzil Magnusonhomas, Lashunda, NP  haloperidol (HALDOL) 5 MG tablet Take 5 mg by mouth every 4 (four) hours as needed for agitation.    [provider]  haloperidol lactate (HALDOL) 5 MG/ML injection Inject 5 mg into the muscle every 4 (four) hours as needed (for agitation).    [provider]  lamoTRIgine (LAMICTAL) 100 MG tablet Take 100 mg by mouth at bedtime.    [provider]  lithium carbonate 150 MG capsule Take 3 capsules (450 mg total) by mouth 2 (two) times daily with a meal. 07/01/17   Denzil Magnusonhomas, Lashunda, NP  LORazepam (ATIVAN) 2 MG tablet Take 2 mg by mouth every 4 (four) hours as needed for anxiety.    [provider]  LORazepam (ATIVAN) 2 MG/ML injection Inject 2 mg into the vein every 4 (four) hours as needed for anxiety.    [provider]   lurasidone (LATUDA) 80 MG TABS tablet Take 80 mg by mouth at bedtime.    [provider]  methylphenidate 36 MG PO CR tablet Take 36 mg by mouth daily.    [provider]  mirtazapine (REMERON) 30 MG tablet Take 30 mg by mouth at bedtime.    [provider]  polyethylene glycol (MIRALAX / GLYCOLAX) packet Take 17 g by mouth daily as needed for mild constipation.    [provider]  prazosin (MINIPRESS) 2 MG capsule Take 1 capsule (2 mg total) by mouth at bedtime. 07/01/17   Denzil Magnusonhomas, Lashunda, NP  promethazine (PHENERGAN) 25 MG tablet Take 1 tablet (25 mg total) by mouth every 6 (six) hours as needed for nausea or vomiting. 02/19/19   Shaune PollackIsaacs, Cameron, MD  topiramate (TOPAMAX) 50 MG tablet Take 50 mg by mouth 2 (two) times daily.    [provider]  vitamin C (ASCORBIC ACID) 500 MG tablet Take 500 mg by mouth daily.    [provider]    Allergies Kiwi extract, Bactrim [sulfamethoxazole-trimethoprim], Peach [prunus persica], and Adhesive [tape]  No family history on file.  Social History Social History   Tobacco Use  . Smoking status: Current Every Day Smoker    Types: Cigarettes  . Smokeless tobacco: Never Used  Substance Use Topics  . Alcohol use: Yes  . Drug use: Yes    Types: Marijuana    Review of Systems Constitutional: No fever/chills Eyes: No visual changes. ENT: No sore throat. No stiff neck no neck pain Cardiovascular: Denies chest pain. Respiratory: Denies shortness of breath. Gastrointestinal:   See HPI, no change in stool Genitourinary: Negative for dysuria. Musculoskeletal: Negative lower extremity swelling Skin: Negative for rash. Neurological: Negative for severe headaches, focal weakness or numbness.   ____________________________________________   PHYSICAL EXAM:  VITAL SIGNS: ED Triage Vitals  Enc Vitals Group     BP 03/14/19 1724 122/84     Pulse Rate 03/14/19 1724 81     Resp 03/14/19 1724 18      Temp 03/14/19 1724 98.7 F (37.1 C)     Temp Source 03/14/19 1724 Oral     SpO2 03/14/19 1724 99 %     Weight 03/14/19 1724 150 lb (68 kg)     Height 03/14/19 1724 5\' 5"  (1.651 m)     Head Circumference --      Peak Flow --      Pain Score 03/14/19 1729 8     Pain Loc --      Pain Edu? --      Excl. in GC? --     Constitutional: Alert and oriented. Well appearing and in no acute distress. Eyes: Conjunctivae are normal Head: Atraumatic HEENT: No congestion/rhinnorhea. Mucous membranes are moist.  Oropharynx non-erythematous Neck:   Nontender with no meningismus, no masses, no stridor Cardiovascular: Normal rate, regular rhythm. Grossly normal heart sounds.  Good peripheral circulation. Respiratory: Normal respiratory effort.  No retractions. Lungs CTAB. Abdominal: Soft and nontender. No distention. No guarding no rebound Back:  There is no focal tenderness or step off.  there is no midline tenderness there are no lesions noted. there is right CVA tenderness GU: Patient declines GU exam understands limitations upon this declining Musculoskeletal: No lower extremity tenderness, no upper extremity tenderness. No joint effusions, no DVT signs strong distal pulses no edema Neurologic:  Normal speech and language. No gross focal neurologic deficits are appreciated.  Skin:  Skin is warm, dry and intact. No rash noted. Psychiatric: Mood and affect are normal. Speech and behavior are normal.  ____________________________________________   LABS (all labs ordered are listed, but only abnormal results are displayed)  Labs Reviewed  URINALYSIS, COMPLETE (UACMP) WITH MICROSCOPIC - Abnormal; Notable for the following components:      Result Value   Color, Urine YELLOW (*)    APPearance CLOUDY (*)    Bacteria, UA RARE (*)    All other components within normal limits  BASIC METABOLIC PANEL  CBC  PREGNANCY, URINE  URINE DRUG SCREEN, QUALITATIVE ([facility] ONLY)  POC URINE PREG, ED     Pertinent labs  results that were available during my care of the patient were reviewed by me and considered in my medical decision making (see chart for details). ____________________________________________  EKG  I personally interpreted any EKGs ordered by me or triage  ____________________________________________  RADIOLOGY  Pertinent labs & imaging results that were available during my care of the patient were reviewed by me and considered in my medical decision making (see chart for details). If possible, patient and/or family made aware of any abnormal findings.  No results found. ____________________________________________    PROCEDURES  Procedure(s) performed: None  Procedures  Critical Care performed: None  ____________________________________________   INITIAL IMPRESSION / ASSESSMENT AND PLAN / ED COURSE  Pertinent labs & imaging results that were available during my care of the patient were reviewed by me and considered in my medical decision making (see chart for details).  With chronic right-sided flank pain sometimes associated with urinary tract infection sometimes it just comes and goes.  Patient is in no acute distress her abdomen is completely benign do not think this represents appendicitis, her pelvic exam, she declined but with no abdominal tenderness low suspicion for PID or STI.  Patient does have a reassuring looking urine however, she does have E. coli recurrently and recurrent apparent pyelonephritis.  I am reluctant to obtain another CT scan.  I do not think that she likely has an abscess there, we are giving her IV Rocephin as she was not able to complete her antibiotic last time she was here and I will obtain a nonradiating ultrasound to further evaluate her kidneys.  I have explained to the patient she will absolutely need to follow closely with urology and take antibiotics and discharge depending on what we  find  ----------------------------------------- 10:44 PM on 03/14/2019 -----------------------------------------  She remains in no acute distress, work-up is quite reassuring ultrasound is reassuring blood work is reassuring urinalysis is reassuring, she is texting on the phone to her boyfriend.  Her pain is gone after Toradol.  She has asked me several times for a work note which we will give.  This is chronic recurrent pain.  Abdomen is benign.  She does not want a pelvic exam.  We will discharge her with close outpatient follow-up.  Given that she did have a  equivocal UA last time but what appeared to be E. coli which was pansensitive I will send her home with a prescription.  Which she is instructed follow-up with OB and urology.  Return precautions follow-up given understood.    ____________________________________________   FINAL CLINICAL IMPRESSION(S) / ED DIAGNOSES  Final diagnoses:  None      This chart was dictated using voice recognition software.  Despite best efforts to proofread,  errors can occur which can change meaning.      Schuyler Amor, MD 03/14/19 2036    Schuyler Amor, MD 03/14/19 2245

## 2019-03-14 NOTE — Discharge Instructions (Addendum)
Turn to the emergency room for any new or worrisome symptoms including increased pain change in her chronic pain fever vomiting.  Follow close with urology and OB/GYN.  Take the antibiotics until they are gone.

## 2019-03-14 NOTE — ED Triage Notes (Signed)
Pt to ED via POV for flank pain. Pt states that she was seen 3 weeks ago for same. Pt was told to come back if she started to have N/V. Pt is in NAD.

## 2019-03-14 NOTE — ED Notes (Signed)
Assessment: pt states she has had her period for three months. Pt states she is having bilateral flank pain, more concentrated on right side. Pt denies urinary incontinence, known hematuria. Pt states she has had intermittent fever this week. Pt ambulatory without difficulty, denies vomiting, nausea.

## 2019-12-04 ENCOUNTER — Other Ambulatory Visit: Payer: Self-pay

## 2019-12-04 ENCOUNTER — Emergency Department
Admission: EM | Admit: 2019-12-04 | Discharge: 2019-12-04 | Disposition: A | Discharge status: Home / Self Care | Payer: Self-pay | Attending: Emergency Medicine | Admitting: Emergency Medicine

## 2019-12-04 DIAGNOSIS — Z87891 Personal history of nicotine dependence: Secondary | ICD-10-CM | POA: Insufficient documentation

## 2019-12-04 DIAGNOSIS — N12 Tubulo-interstitial nephritis, not specified as acute or chronic: Secondary | ICD-10-CM | POA: Insufficient documentation

## 2019-12-04 DIAGNOSIS — R350 Frequency of micturition: Secondary | ICD-10-CM | POA: Insufficient documentation

## 2019-12-04 DIAGNOSIS — R309 Painful micturition, unspecified: Secondary | ICD-10-CM | POA: Insufficient documentation

## 2019-12-04 DIAGNOSIS — Z79899 Other long term (current) drug therapy: Secondary | ICD-10-CM | POA: Insufficient documentation

## 2019-12-04 DIAGNOSIS — R112 Nausea with vomiting, unspecified: Secondary | ICD-10-CM | POA: Insufficient documentation

## 2019-12-04 DIAGNOSIS — F1210 Cannabis abuse, uncomplicated: Secondary | ICD-10-CM | POA: Insufficient documentation

## 2019-12-04 LAB — URINALYSIS, COMPLETE (UACMP) WITH MICROSCOPIC
Bilirubin Urine: NEGATIVE
Glucose, UA: NEGATIVE mg/dL
Ketones, ur: 80 mg/dL — AB
Nitrite: POSITIVE — AB
Protein, ur: 100 mg/dL — AB
Specific Gravity, Urine: 1.021 (ref 1.005–1.030)
WBC, UA: >50 WBC/hpf — ABNORMAL HIGH (ref 0–5)
pH: 7 (ref 5.0–8.0)

## 2019-12-04 LAB — CBC
HCT: 36.6 % (ref 36.0–46.0)
Hemoglobin: 13.2 g/dL (ref 12.0–15.0)
MCH: 30.6 pg (ref 26.0–34.0)
MCHC: 36.1 g/dL — ABNORMAL HIGH (ref 30.0–36.0)
MCV: 84.7 fL (ref 80.0–100.0)
Platelets: 141 10*3/uL — ABNORMAL LOW (ref 150–400)
RBC: 4.32 MIL/uL (ref 3.87–5.11)
RDW: 11.8 % (ref 11.5–15.5)
WBC: 10.1 10*3/uL (ref 4.0–10.5)
nRBC: 0 % (ref 0.0–0.2)

## 2019-12-04 LAB — BASIC METABOLIC PANEL
Anion gap: 13 (ref 5–15)
BUN: 11 mg/dL (ref 6–20)
CO2: 19 mmol/L — ABNORMAL LOW (ref 22–32)
Calcium: 9.6 mg/dL (ref 8.9–10.3)
Chloride: 102 mmol/L (ref 98–111)
Creatinine, Ser: 0.74 mg/dL (ref 0.44–1.00)
GFR calc Af Amer: >60 mL/min (ref >60)
GFR calc non Af Amer: >60 mL/min (ref >60)
Glucose, Bld: 103 mg/dL — ABNORMAL HIGH (ref 70–99)
Potassium: 3.4 mmol/L — ABNORMAL LOW (ref 3.5–5.1)
Sodium: 134 mmol/L — ABNORMAL LOW (ref 135–145)

## 2019-12-04 LAB — POCT PREGNANCY, URINE: Preg Test, Ur: NEGATIVE

## 2019-12-04 MED ORDER — ONDANSETRON HCL 4 MG/2ML IJ SOLN
4.0000 mg | Freq: Once | INTRAMUSCULAR | Status: AC
  Administered 2019-12-04: 4 mg via INTRAVENOUS
  Filled 2019-12-04: qty 2

## 2019-12-04 MED ORDER — SODIUM CHLORIDE 0.9 % IV SOLN
1.0000 g | Freq: Once | INTRAVENOUS | Status: AC
  Administered 2019-12-04: 1 g via INTRAVENOUS
  Filled 2019-12-04: qty 10

## 2019-12-04 MED ORDER — LACTATED RINGERS IV BOLUS
1000.0000 mL | Freq: Once | INTRAVENOUS | Status: AC
  Administered 2019-12-04: 1000 mL via INTRAVENOUS

## 2019-12-04 MED ORDER — ONDANSETRON 4 MG PO TBDP: 4.0000 mg | ORAL_TABLET | Freq: Three times a day (TID) | ORAL | 0 refills | Status: DC | PRN

## 2019-12-04 MED ORDER — ACETAMINOPHEN 500 MG PO TABS
1000.0000 mg | ORAL_TABLET | Freq: Once | ORAL | Status: AC
  Administered 2019-12-04: 1000 mg via ORAL
  Filled 2019-12-04: qty 2

## 2019-12-04 MED ORDER — POTASSIUM CHLORIDE CRYS ER 20 MEQ PO TBCR
20.0000 meq | EXTENDED_RELEASE_TABLET | Freq: Once | ORAL | Status: AC
  Administered 2019-12-04: 20 meq via ORAL
  Filled 2019-12-04: qty 1

## 2019-12-04 MED ORDER — KETOROLAC TROMETHAMINE 30 MG/ML IJ SOLN
15.0000 mg | Freq: Once | INTRAMUSCULAR | Status: AC
  Administered 2019-12-04: 15 mg via INTRAVENOUS
  Filled 2019-12-04: qty 1

## 2019-12-04 MED ORDER — CEPHALEXIN 500 MG PO CAPS: 1000.0000 mg | ORAL_CAPSULE | Freq: Two times a day (BID) | ORAL | 0 refills | Status: AC

## 2019-12-04 NOTE — ED Notes (Signed)
Patient reminded to keep arm straight in which Rocephin is infusing.

## 2019-12-04 NOTE — ED Triage Notes (Signed)
Pt states she has R flank pain. States N&V. States known kidney cyst. Symptoms x 3-4 days. States fever as well. A&O, in wheelchair.

## 2019-12-04 NOTE — ED Provider Notes (Signed)
[facility] Emergency Department Provider Note   ____________________________________________   First MD Initiated Contact with Patient 12/04/19 1122     (approximate)  I have reviewed the triage vital signs and the nursing notes.   HISTORY  Chief Complaint Flank Pain    HPI Jacqueline Whitehead is a 19 y.o. female with past medical history of PTSD and bipolar disorder who presents to the ED complaining of flank pain.  Patient reports that she has had 3 to 4 days of pain primarily affecting her left flank, but also seems to spread to her right flank and around to her abdomen.  This is been associated with some discomfort when she urinates as well as urinary frequency, she denies any hematuria, vaginal bleeding, or vaginal discharge.  She has also felt hot at home and is concerned she might have a fever, but has not taken her temperature.  She has been feeling nauseous with multiple episodes of vomiting, but denies any diarrhea.  Symptoms feel similar to her prior UTIs and she denies any history of kidney stones, but does report a cyst on her left kidney.        Past Medical History:  Diagnosis Date  . Anxiety   . Bipolar 1 disorder (HCC)   . Eating disorder   . Personality disorder (HCC)   . PTSD (post-traumatic stress disorder)   . Suicide attempt [facility])     Patient Active Problem List   Diagnosis Date Noted  . 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 Suicidal ideation 06/30/2017  . MDD (major depressive disorder) 06/30/2017    History reviewed. No pertinent surgical history.  Prior to Admission medications   Medication Sig Start Date End Date Taking? Authorizing Provider  cephALEXin (KEFLEX) 500 MG capsule Take 2 capsules (1,000 mg total) by mouth 2 (two) times daily for 10 days. 12/04/19 12/14/19  Chesley Noon, MD  cetirizine (ZYRTEC) 10 MG tablet Take 10 mg by mouth at bedtime.    [provider]  Cholecalciferol (VITAMIN D) 2000 units tablet Take 2,000 Units by mouth daily.    [provider]  clindamycin-benzoyl peroxide (BENZACLIN) gel Apply 1 application topically at bedtime.    [provider]  feeding supplement, ENSURE ENLIVE, (ENSURE ENLIVE) LIQD Take 237 mLs by mouth 2 (two) times daily between meals. 07/02/17   Denzil Magnuson, NP  haloperidol (HALDOL) 5 MG tablet Take 5 mg by mouth every 4 (four) hours as needed for agitation.    [provider]  haloperidol lactate (HALDOL) 5 MG/ML injection Inject 5 mg into the muscle every 4 (four) hours as needed (for agitation).    [provider]  lamoTRIgine (LAMICTAL) 100 MG tablet Take 100 mg by mouth at bedtime.    [provider]  lithium carbonate 150 MG capsule Take 3 capsules (450 mg total) by mouth 2 (two) times daily with a meal. 07/01/17   Denzil Magnuson, NP  LORazepam (ATIVAN) 2 MG tablet Take 2 mg by mouth every 4 (four) hours as needed for anxiety.    [provider]  LORazepam (ATIVAN) 2 MG/ML injection Inject 2 mg into the vein every 4 (four) hours as needed for anxiety.    [provider]  lurasidone (LATUDA) 80 MG TABS tablet Take 80 mg by mouth at bedtime.    [provider]  methylphenidate 36 MG PO CR tablet Take 36 mg by mouth daily.    [provider]  mirtazapine (REMERON) 30 MG tablet Take 30 mg by mouth at bedtime.    [provider]  ondansetron (ZOFRAN ODT) 4 MG disintegrating tablet Take 1 tablet (4 mg total) by mouth every 8 (eight) hours as needed for nausea or vomiting. 12/04/19   Chesley Noon, MD  ondansetron (ZOFRAN) 4 MG tablet Take 1 tablet (4 mg total) by mouth every 8 (eight) hours as needed for nausea or vomiting. 03/14/19   Jeanmarie Plant, MD  polyethylene glycol (MIRALAX / GLYCOLAX) packet Take 17 g by mouth daily as needed for mild constipation.    [provider]  prazosin  (MINIPRESS) 2 MG capsule Take 1 capsule (2 mg total) by mouth at bedtime. 07/01/17   Denzil Magnuson, NP  promethazine (PHENERGAN) 25 MG tablet Take 1 tablet (25 mg total) by mouth every 6 (six) hours as needed for nausea or vomiting. 02/19/19   Shaune Pollack, MD  topiramate (TOPAMAX) 50 MG tablet Take 50 mg by mouth 2 (two) times daily.    [provider]  vitamin C (ASCORBIC ACID) 500 MG tablet Take 500 mg by mouth daily.    [provider]    Allergies Kiwi extract, Bactrim [sulfamethoxazole-trimethoprim], Peach [prunus persica], and Adhesive [tape]  History reviewed. No pertinent family history.  Social History Social History   Tobacco Use  . Smoking status: Former Smoker    Types: Cigarettes  . Smokeless tobacco: Never Used  Substance Use Topics  . Alcohol use: Yes  . Drug use: Yes    Types: Marijuana    Review of Systems  Constitutional: Positive for fever/chills Eyes: No visual changes. ENT: No sore throat. Cardiovascular: Denies chest pain. Respiratory: Denies shortness of breath. Gastrointestinal: Positive for flank and abdominal pain.  Positive for nausea and vomiting.  No diarrhea.  No constipation. Genitourinary: Negative for dysuria. Musculoskeletal: Negative for back pain. Skin: Negative for rash. Neurological: Negative for headaches, focal weakness or numbness.  ____________________________________________   PHYSICAL EXAM:  VITAL SIGNS: ED Triage Vitals  Enc Vitals Group     BP 12/04/19 1002 104/86     Pulse Rate 12/04/19 1002 (!) 108     Resp 12/04/19 1002 16     Temp 12/04/19 1002 99.5 F (37.5 C)     Temp Source 12/04/19 1002 Oral     SpO2 12/04/19 1002 98 %     Weight 12/04/19 1000 145 lb (65.8 kg)     Height 12/04/19 1000 5\' 4"  (1.626 m)     Head Circumference --      Peak Flow --      Pain Score 12/04/19 1000 7     Pain Loc --      Pain Edu? --      Excl. in GC? --     Constitutional: Alert and oriented. Eyes:  Conjunctivae are normal. Head: Atraumatic. Nose: No congestion/rhinnorhea. Mouth/Throat: Mucous membranes are moist. Neck: Normal ROM Cardiovascular: Normal rate, regular rhythm. Grossly normal heart sounds. Respiratory: Normal respiratory effort.  No retractions. Lungs CTAB. Gastrointestinal: Soft and nontender. No distention.  CVA tenderness noted bilaterally. Genitourinary: deferred Musculoskeletal: No lower extremity tenderness nor edema. Neurologic:  Normal speech and language. No gross focal neurologic deficits are appreciated. Skin:  Skin is warm, dry and intact. No rash noted. Psychiatric: Mood and affect are normal. Speech and behavior are normal.  ____________________________________________   LABS (all labs ordered are listed, but only abnormal results are displayed)  Labs Reviewed  URINALYSIS, COMPLETE (UACMP) WITH MICROSCOPIC -  Abnormal; Notable for the following components:      Result Value   Color, Urine AMBER (*)    APPearance HAZY (*)    Hgb urine dipstick SMALL (*)    Ketones, ur 80 (*)    Protein, ur 100 (*)    Nitrite POSITIVE (*)    Leukocytes,Ua MODERATE (*)    WBC, UA >50 (*)    Bacteria, UA MANY (*)    Non Squamous Epithelial PRESENT (*)    All other components within normal limits  BASIC METABOLIC PANEL - Abnormal; Notable for the following components:   Sodium 134 (*)    Potassium 3.4 (*)    CO2 19 (*)    Glucose, Bld 103 (*)    All other components within normal limits  CBC - Abnormal; Notable for the following components:   MCHC 36.1 (*)    Platelets 141 (*)    All other components within normal limits  URINE CULTURE  POC URINE PREG, ED  POCT PREGNANCY, URINE     PROCEDURES  Procedure(s) performed (including Critical Care):  Procedures   ____________________________________________   INITIAL IMPRESSION / ASSESSMENT AND PLAN / ED COURSE       19 year old female presents to the ED complaining of worsening left flank pain as  well as urinary symptoms and subjective fevers over the past 3 to 4 days.  Given her CVA tenderness, I am most concerned for pyelonephritis.  Urine pregnancy is negative and UA is pending, we will treat symptomatically with Toradol and Zofran, hydrate with IV fluids.  Lab work thus far is unremarkable.  I have a low suspicion for nephrolithiasis and will hold off on CT scan for now unless UA were to be unrevealing.  Patient is feeling better following Toradol, Zofran, and IV fluids.  UA is consistent with UTI, urine culture was sent and patient given initial dose of Rocephin.  We will treat with 10 days of Keflex for suspected pyelonephritis and patient given referral to establish care with PCP.  She was counseled to return to the ED for new worsening symptoms, patient agrees with plan.      ____________________________________________   FINAL CLINICAL IMPRESSION(S) / ED DIAGNOSES  Final diagnoses:  Pyelonephritis     ED Discharge Orders         Ordered    cephALEXin (KEFLEX) 500 MG capsule  2 times daily     12/04/19 1426    ondansetron (ZOFRAN ODT) 4 MG disintegrating tablet  Every 8 hours PRN     12/04/19 1426           Note:  This document was prepared using Dragon voice recognition software and may include unintentional dictation errors.   Blake Divine, MD 12/04/19 [phone]

## 2019-12-04 NOTE — ED Notes (Signed)
Patient ambulated to and from hallway bathroom with a steady gait. 

## 2019-12-06 LAB — URINE CULTURE: Culture: >=100000 — AB

## 2020-03-21 ENCOUNTER — Telehealth: Payer: Self-pay | Admitting: General Practice

## 2020-03-21 NOTE — Telephone Encounter (Signed)
Individual has been contacted 3+ times regarding ED referral. No further attempts to contact individual will be made. 

## 2020-11-24 IMAGING — CT CT ABDOMEN AND PELVIS WITH CONTRAST
2 of 4 series · 16 of 46 positions shown, 18 images · IV contrast (APPLIED)
Comparison: None.

CLINICAL DATA: Right flank pain over the last month, worsening
recently. Intermittent fever.

EXAM:
CT ABDOMEN AND PELVIS WITH CONTRAST
TECHNIQUE: Multidetector CT imaging of the abdomen and pelvis was performed
using the standard protocol following bolus administration of
intravenous contrast.
CONTRAST:  100mL OMNIPAQUE IOHEXOL 300 MG/ML  SOLN

[Series 2: routine abd/pel with · axial · 0.63mm/px · z∈[-49,+396]mm · 13 of 97 slices shown, 15 images]
[im 4/97  soft-tissue]
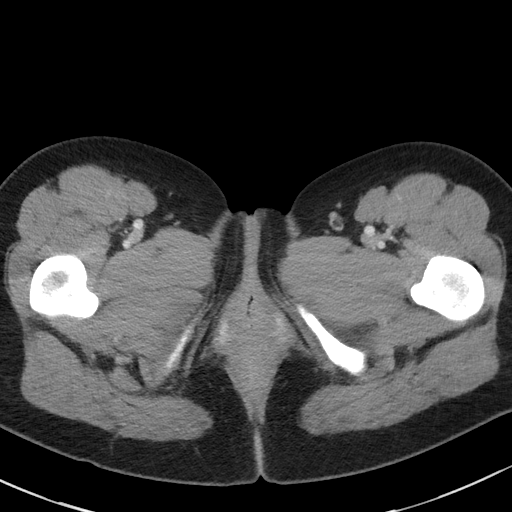
[im 4/97  bone]
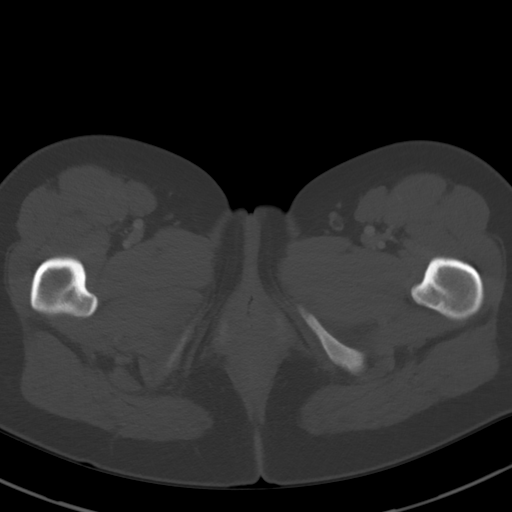
[im 12/97  soft-tissue]
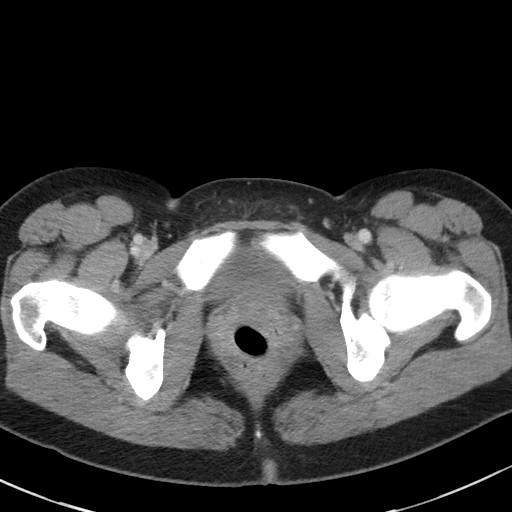
[im 20/97  soft-tissue]
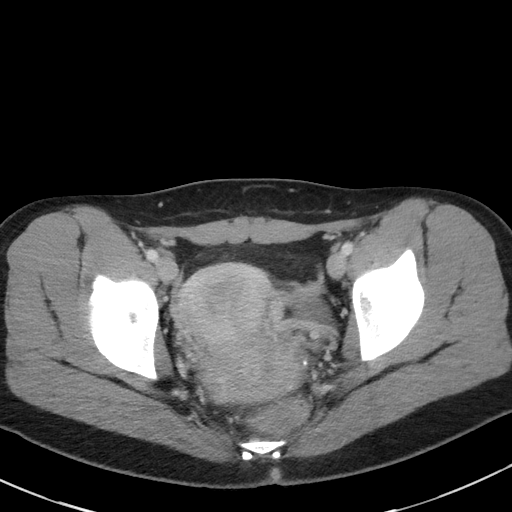
[im 27/97  soft-tissue]
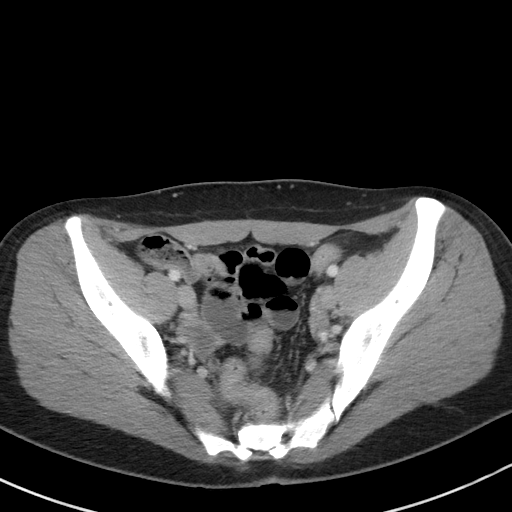
[im 35/97  soft-tissue]
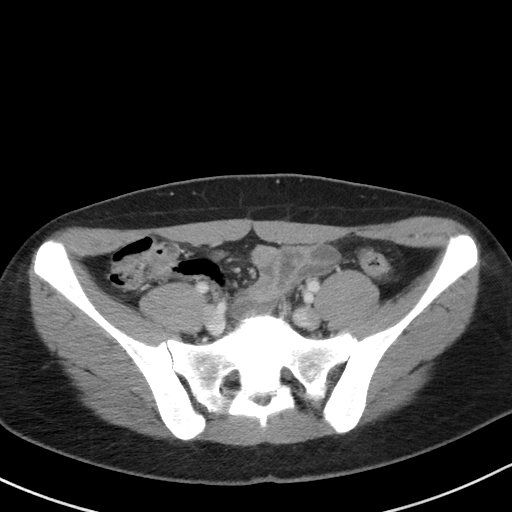
[im 43/97  soft-tissue]
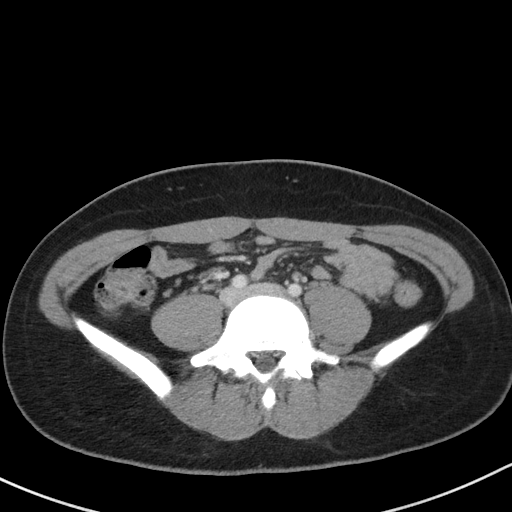
[im 50/97  soft-tissue]
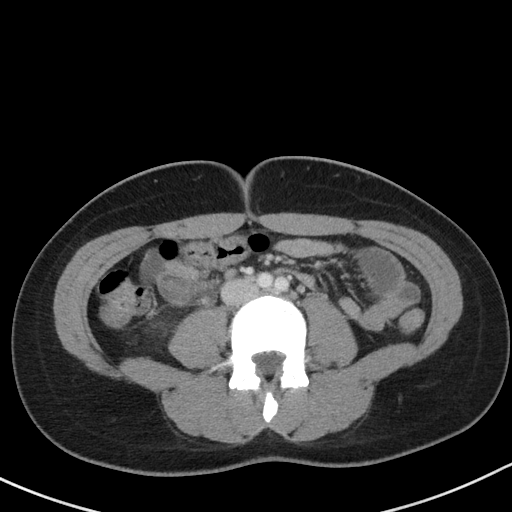
[im 54/97  soft-tissue]
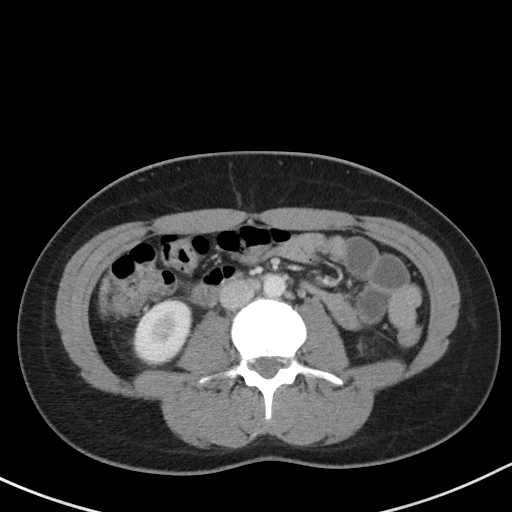
[im 62/97  soft-tissue]
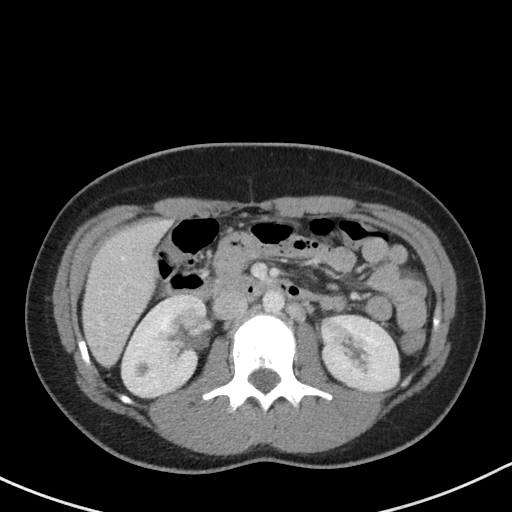
[im 62/97  bone]
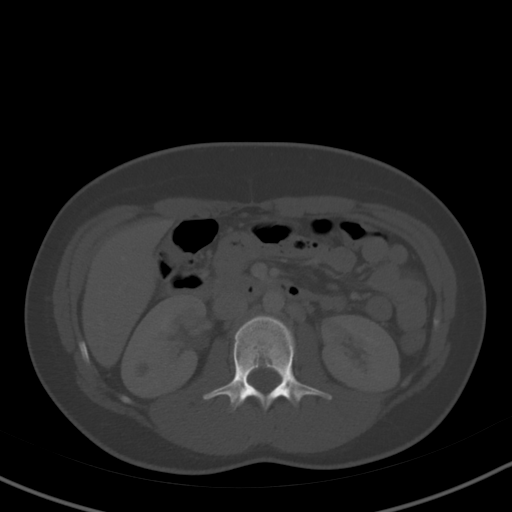
[im 70/97  soft-tissue]
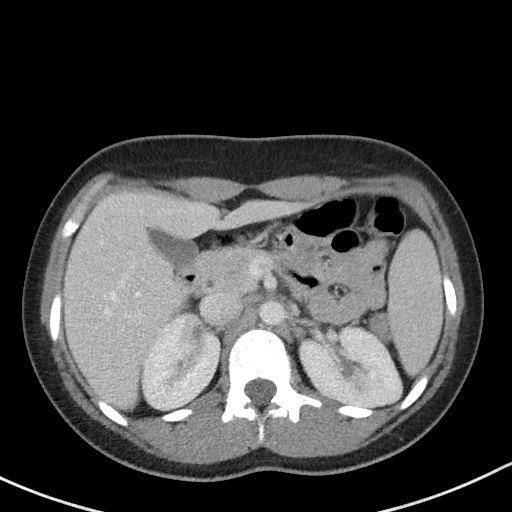
[im 77/97  soft-tissue]
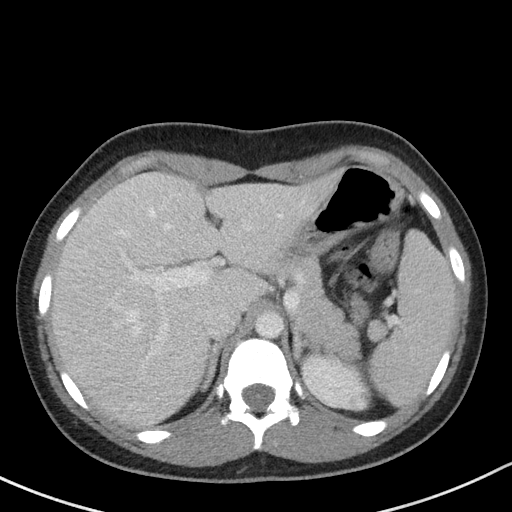
[im 85/97  soft-tissue]
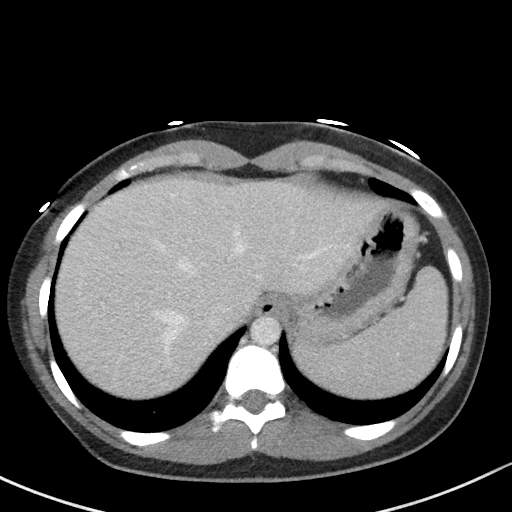
[im 93/97  soft-tissue]
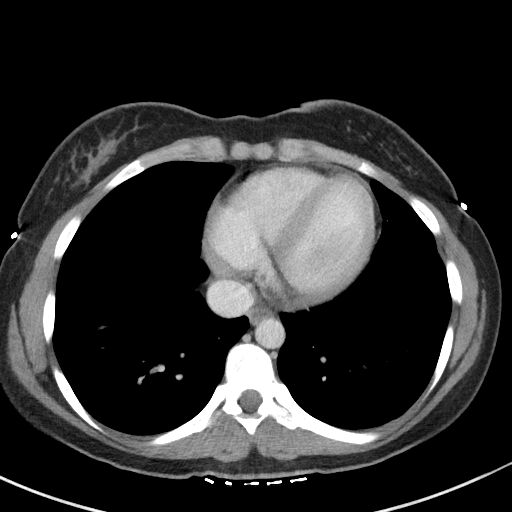

[Series 5: coronal st · coronal · 0.71mm/px · 3 of 74 slices shown]
[im 25/74  soft-tissue]
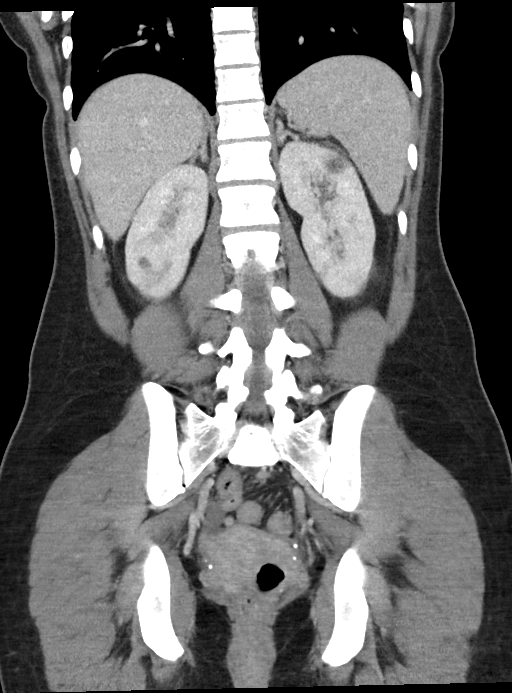
[im 33/74  soft-tissue]
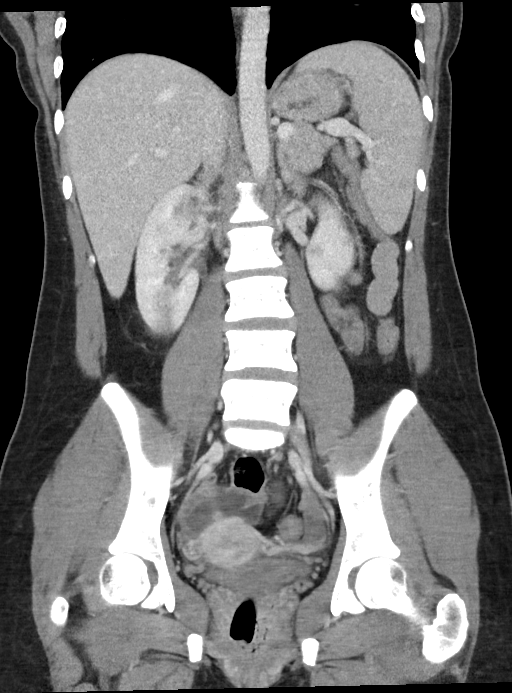
[im 41/74  soft-tissue]
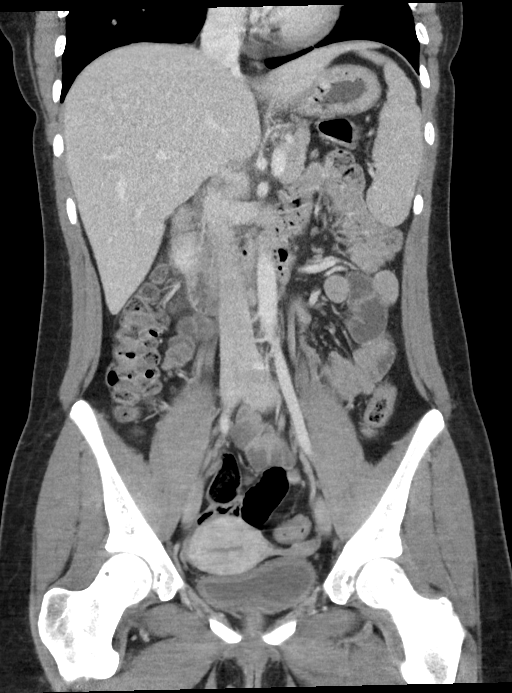

[16 of 46 positions shown; findings below may reference images not displayed]

FINDINGS: Lower chest: Normal

Hepatobiliary: Normal

Pancreas: Normal

Spleen: Normal

Adrenals/Urinary Tract: Adrenal glands are normal. There are
multiple foci of poorly functioning renal tissue on both sides,
right more than left, consistent with pyelonephritis/lobar
nephronia. On each side, there is a 1 cm low-density presumed
represent a cyst. Early renal abscess can not be ruled out but is
less likely. No hydronephrosis. No bladder abnormality.

Stomach/Bowel: Normal

Vascular/Lymphatic: Normal

Reproductive: Normal

Other: None

Musculoskeletal: Normal
IMPRESSION: Findings consistent with pyelonephritis/lobar nephronia. This is
slightly more extensive on the right than on the left. 1 cm
low-density foci in each kidney probably representing incidental
cysts. No renal obstruction. No stone disease.

## 2022-04-28 ENCOUNTER — Emergency Department
Admission: EM | Admit: 2022-04-28 | Discharge: 2022-04-28 | Disposition: A | Discharge status: Home / Self Care | Payer: Worker's Compensation | Attending: Emergency Medicine | Admitting: Emergency Medicine

## 2022-04-28 ENCOUNTER — Encounter: Payer: Self-pay | Admitting: Emergency Medicine

## 2022-04-28 ENCOUNTER — Other Ambulatory Visit: Payer: Self-pay

## 2022-04-28 DIAGNOSIS — S6991XA Unspecified injury of right wrist, hand and finger(s), initial encounter: Secondary | ICD-10-CM | POA: Diagnosis present

## 2022-04-28 DIAGNOSIS — Y990 Civilian activity done for income or pay: Secondary | ICD-10-CM | POA: Insufficient documentation

## 2022-04-28 DIAGNOSIS — Z23 Encounter for immunization: Secondary | ICD-10-CM | POA: Diagnosis not present

## 2022-04-28 DIAGNOSIS — S61212A Laceration without foreign body of right middle finger without damage to nail, initial encounter: Secondary | ICD-10-CM | POA: Diagnosis not present

## 2022-04-28 DIAGNOSIS — W268XXA Contact with other sharp object(s), not elsewhere classified, initial encounter: Secondary | ICD-10-CM | POA: Insufficient documentation

## 2022-04-28 MED ORDER — TETANUS-DIPHTH-ACELL PERTUSSIS 5-2.5-18.5 LF-MCG/0.5 IM SUSY
0.5000 mL | PREFILLED_SYRINGE | Freq: Once | INTRAMUSCULAR | Status: AC
  Administered 2022-04-28: 0.5 mL via INTRAMUSCULAR
  Filled 2022-04-28: qty 0.5

## 2022-04-28 NOTE — ED Triage Notes (Signed)
Pt reports works for [employer] and was cleaning the slicer and the paper slipped and she cut her right hand middle finger

## 2022-04-28 NOTE — Discharge Instructions (Addendum)
-  Keep the Band-Aid on for at least 24 to 48 hours.  The Dermabond glue will dissolve on its own in approximately 5 to 7 days.  Do not apply any lotions, ointments, or emollients, as this may dissolve the glue prematurely.  It is however waterproof.  Avoid any heavy usage of the right hand.  -You may take Tylenol/ibuprofen as needed for pain.  -Return to the emergency department anytime if you begin to experience any new or worsening symptoms.

## 2022-04-28 NOTE — ED Provider Notes (Signed)
[facility] Provider Note    Event Date/Time   First MD Initiated Contact with Patient 04/28/22 1922     (approximate)   History   Chief Complaint Laceration   HPI Jacqueline Whitehead is a 21 y.o. female, history of of anxiety, PTSD, present disorder, bipolar 1 disorder, eating disorder, presents to the emergency department for evaluation of laceration to the right middle finger.  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  Bleeding controlled with direct pressure.  Denies any other injuries.  She is not on blood thinners.  She is unsure if she is up-to-date on her tetanus.  Denies any numbness/tingling in the affected extremity, cold sensation, hand/wrist pain, rash/lesions, or fever/chills.  History Limitations: No limitations        Physical Exam  Triage Vital Signs: ED Triage Vitals  Enc Vitals Group     BP 04/28/22 1820 123/78     Pulse Rate 04/28/22 1820 74     Resp 04/28/22 1820 16     Temp 04/28/22 1820 98.2 F (36.8 C)     Temp Source 04/28/22 1820 Oral     SpO2 04/28/22 1820 96 %     Weight 04/28/22 1815 145 lb 8.1 oz (66 kg)     Height 04/28/22 1815 5\' 4"  (1.626 m)     Head Circumference --      Peak Flow --      Pain Score 04/28/22 1815 6     Pain Loc --      Pain Edu? --      Excl. in GC? --     Most recent vital signs: Vitals:   04/28/22 1820  BP: 123/78  Pulse: 74  Resp: 16  Temp: 98.2 F (36.8 C)  SpO2: 96%    General: Awake, NAD.  Skin: Warm, dry. No rashes or lesions.  Eyes: PERRL. Conjunctivae normal.  CV: Good peripheral perfusion.  Resp: Normal effort.  Abd: Soft, non-tender. No distention.  Neuro: At baseline. No gross neurological deficits.   Focused Exam: 1 cm laceration along the pad of the right middle finger.  Mild bleeding present.  No foreign bodies.  Normal range of motion at the DIP and PIP joints.  No injury to the nailbed.  Sensation  intact.  Physical Exam    ED Results / Procedures / Treatments  Labs (all labs ordered are listed, but only abnormal results are displayed) Labs Reviewed - No data to display   EKG N/A.   RADIOLOGY  ED Provider Interpretation: N/A.  No results found.  PROCEDURES:  Critical Care performed: N/A.  04/30/22.Laceration Repair  Date/Time: 04/28/2022 8:42 PM  Performed by: 04/30/2022, PA Authorized by: Varney Daily, PA   Consent:    Consent obtained:  Verbal   Consent given by:  Patient   Risks, benefits, and alternatives were discussed: yes     Risks discussed:  Infection, pain, poor cosmetic result, retained foreign body, vascular damage and need for additional repair   Alternatives discussed:  No treatment Universal protocol:    Patient identity confirmed:  Verbally with patient Anesthesia:    Anesthesia method:  None Laceration details:    Location:  Finger   Finger location:  R long finger   Length (cm):  1   Depth (mm):  1 Pre-procedure details:    Preparation:  Patient was prepped and draped in usual sterile fashion Exploration:  Hemostasis achieved with:  Direct pressure   Wound extent: no foreign bodies/material noted, no muscle damage noted, no nerve damage noted, no tendon damage noted, no underlying fracture noted and no vascular damage noted   Treatment:    Area cleansed with:  Soap and water   Amount of cleaning:  Extensive   Irrigation solution:  Tap water   Irrigation volume:  2000 ml   Irrigation method:  Tap   Debridement:  None   Undermining:  None Skin repair:    Repair method:  Tissue adhesive Approximation:    Approximation:  Close Repair type:    Repair type:  Simple Post-procedure details:    Dressing:  Adhesive bandage   Procedure completion:  Tolerated well, no immediate complications     MEDICATIONS ORDERED IN ED: Medications  Tdap (BOOSTRIX) injection 0.5 mL (has no administration in time range)      IMPRESSION / MDM / ASSESSMENT AND PLAN / ED COURSE  I reviewed the triage vital signs and the nursing notes.                              Differential diagnosis includes, but is not limited to, finger laceration, foreign body, flexor tendon injury, nailbed injury  Assessment/Plan Patient presents with 1 cm linear laceration to the pad of the right middle finger after injury from a meat slicer.  Provide her with tetanus booster.  No evidence of any tendon injuries or foreign bodies.  Bleeding is well controlled direct pressure.  Repaired the laceration with Dermabond tissue adhesive.  No immediate complications.  Wound appears clean.  No need for antibiotic prophylaxis at this time.  No further work-up or treatment indicated at this time.  Will discharge.  Provided the patient with anticipatory guidance, return precautions, and educational material. Encouraged the patient to return to the emergency department at any time if they begin to experience any new or worsening symptoms. Patient expressed understanding and agreed with the plan.   Patient's presentation is most consistent with acute, uncomplicated illness.       FINAL CLINICAL IMPRESSION(S) / ED DIAGNOSES   Final diagnoses:  Laceration of right middle finger without foreign body without damage to nail, initial encounter     Rx / DC Orders   ED Discharge Orders     None        Note:  This document was prepared using Dragon voice recognition software and may include unintentional dictation errors.

## 2023-07-11 HISTORY — PX: WRIST SURGERY: SHX841

## 2023-12-12 ENCOUNTER — Other Ambulatory Visit: Payer: Self-pay

## 2023-12-12 ENCOUNTER — Emergency Department: Payer: MEDICAID

## 2023-12-12 ENCOUNTER — Emergency Department
Admission: EM | Admit: 2023-12-12 | Discharge: 2023-12-12 | Disposition: A | Discharge status: Home / Self Care | Payer: MEDICAID | Attending: Emergency Medicine | Admitting: Emergency Medicine

## 2023-12-12 DIAGNOSIS — M25571 Pain in right ankle and joints of right foot: Secondary | ICD-10-CM | POA: Insufficient documentation

## 2023-12-12 DIAGNOSIS — M25531 Pain in right wrist: Secondary | ICD-10-CM | POA: Insufficient documentation

## 2023-12-12 DIAGNOSIS — M79604 Pain in right leg: Secondary | ICD-10-CM

## 2023-12-12 NOTE — ED Triage Notes (Addendum)
 Pt comes with ankle and wrist pain. Pt states right wrist pain. Pt states right ankle pain and swelling. Pt also states right head pain and temple has been swollen. Pt states this all started 4-5 days ago. Pt states she does have some pain in arm to left arm.

## 2023-12-12 NOTE — Discharge Instructions (Addendum)
 Call make appointment with Dr. Mathis Bud to discuss your continued problems with your right wrist.  Continue taking ibuprofen every 6 hours with food for your wrist pain.

## 2023-12-12 NOTE — ED Provider Notes (Signed)
 [facility] Provider Note    Event Date/Time   First MD Initiated Contact with Patient 12/12/23 1024     (approximate)   History   Ankle Pain and Wrist Pain   HPI  Jacqueline Whitehead is a 23 y.o. female   presents to the ED with complaint of right wrist and ankle pain.  Patient states that she had a ganglion cyst removed from her right wrist in November 2024.  She continues to wear her wrist splint but has not followed up with her orthopedist since that time.  Patient has taken some ibuprofen intermittently.  She also complains of pain in her right ankle with some swelling.  She denies any recent injury to the wrist or ankle.  She states that all of this started approximately 4 to 5 days ago.      Physical Exam   Triage Vital Signs: ED Triage Vitals [12/12/23 1004]  Encounter Vitals Group     BP (!) 139/111     Systolic BP Percentile      Diastolic BP Percentile      Pulse Rate 95     Resp 18     Temp 98 F (36.7 C)     Temp src      SpO2 100 %     Weight      Height      Head Circumference      Peak Flow      Pain Score      Pain Loc      Pain Education      Exclude from Growth Chart     Most recent vital signs: Vitals:   12/12/23 1004 12/12/23 1157  BP: (!) 139/111 123/89  Pulse: 95   Resp: 18   Temp: 98 F (36.7 C)   SpO2: 100%      General: Awake, no distress.  Alert, talkative, cooperative. CV:  Good peripheral perfusion.  Resp:  Normal effort.  Abd:  No distention.  Other:  Right wrist with generalized tenderness on palpation of the volar aspect.  Surgical site has healed well without any signs of infection.  No discoloration.  Radial pulses present.  Patient is able move digits distally without any difficulty.  On examination of the right ankle there is no gross deformity or discoloration.  Range of motion is that restriction.  Motor or sensory function tact distal to this area.   ED Results / Procedures / Treatments    Labs (all labs ordered are listed, but only abnormal results are displayed) Labs Reviewed - No data to display    RADIOLOGY Right wrist and right ankle x-ray images were reviewed and interpreted by myself independent the radiologist and was negative for fracture or dislocation.    PROCEDURES:  Critical Care performed:   Procedures   MEDICATIONS ORDERED IN ED: Medications - No data to display   IMPRESSION / MDM / ASSESSMENT AND PLAN / ED COURSE  I reviewed the triage vital signs and the nursing notes.   Differential diagnosis includes, but is not limited to, right wrist pain with recent ganglion cyst removal.  Cellulitis/infection ruled out on exam.  Right ankle pain, strain, degenerative joint disease, gout.  23 year old female presents to the ED with complaint of right wrist and ankle pain.  Patient had a ganglion cyst moved from her right wrist in November 2024 and states she has not followed up with Dr. Reginold Capron.  She is encouraged to take ibuprofen  every 6 hours routinely for right now.  She is encouraged also to call and make an appointment with orthopedist for follow-up and her continued wrist pain.  She was reassured that the x-rays of her right ankle were benign.      Patient's presentation is most consistent with acute complicated illness / injury requiring diagnostic workup.  FINAL CLINICAL IMPRESSION(S) / ED DIAGNOSES   Final diagnoses:  Acute wrist pain, right  Acute pain of right lower extremity     Rx / DC Orders   ED Discharge Orders     None        Note:  This document was prepared using Dragon voice recognition software and may include unintentional dictation errors.   Stafford Eagles, PA-C 12/12/23 1539    Arline Bennett, MD 12/21/23 [id number]

## 2023-12-12 NOTE — ED Notes (Signed)
 No obvious deformities noted, swelling, or bruising, pt states for the past 4 days she has been having pain in her right knee that radiates up her thigh into her right hip, also states that the pain radiates down into her right ankle and she feels like there may be some swelling noted, when pt is asked she states that she does have lower center lower back pain. Pt denies any known injury, denies working excessive amount of shifts or being on her feet any longer than usual, pt denies wearing uncomfortable shoes for work

## 2024-02-12 DIAGNOSIS — G5601 Carpal tunnel syndrome, right upper limb: Secondary | ICD-10-CM | POA: Insufficient documentation

## 2024-03-01 ENCOUNTER — Ambulatory Visit: Payer: MEDICAID

## 2024-03-01 VITALS — BP 116/68 | Ht 65.0 in | Wt 170.5 lb

## 2024-03-01 DIAGNOSIS — Z3201 Encounter for pregnancy test, result positive: Secondary | ICD-10-CM | POA: Diagnosis not present

## 2024-03-01 DIAGNOSIS — Z309 Encounter for contraceptive management, unspecified: Secondary | ICD-10-CM

## 2024-03-01 LAB — PREGNANCY, URINE: Preg Test, Ur: POSITIVE — AB

## 2024-03-01 MED ORDER — PRENATAL 27-0.8 MG PO TABS: 1.0000 | ORAL_TABLET | Freq: Every day | ORAL | Status: AC

## 2024-03-01 NOTE — Progress Notes (Signed)
 UPT positive. Unsure of where she plans to receive prenatal care; encouraged to establish care ASAP. List of local providers given to patient.   The patient was dispensed prenatal vitamins #100 today. I provided counseling today regarding the medication. We discussed the medication, the side effects and when to call clinic.   Positive pregnancy packet reviewed and given to patient. Also counseled on hydration and when to seek medical attention.  Patient given the opportunity to ask questions. Questions answered.   Doyce CINDERELLA Shuck, RN

## 2024-03-15 ENCOUNTER — Ambulatory Visit (INDEPENDENT_AMBULATORY_CARE_PROVIDER_SITE_OTHER): Payer: MEDICAID

## 2024-03-15 ENCOUNTER — Ambulatory Visit: Payer: MEDICAID

## 2024-03-15 VITALS — BP 131/88 | Wt 169.0 lb

## 2024-03-15 VITALS — BP 131/88 | Ht 65.0 in | Wt 169.0 lb

## 2024-03-15 DIAGNOSIS — Z3A08 8 weeks gestation of pregnancy: Secondary | ICD-10-CM

## 2024-03-15 DIAGNOSIS — Z3400 Encounter for supervision of normal first pregnancy, unspecified trimester: Secondary | ICD-10-CM | POA: Insufficient documentation

## 2024-03-15 DIAGNOSIS — Z3481 Encounter for supervision of other normal pregnancy, first trimester: Secondary | ICD-10-CM

## 2024-03-15 NOTE — Progress Notes (Deleted)
 New OB Intake  I connected with  Jacqueline Whitehead on 03/15/24 at 11:15 AM EDT by here in the office she came in for this apt.   I explained I am completing New OB Intake today. We discussed her EDD of 10/26/2024 that is based on LMP of 01/20/2024. Pt is G1/P0. I reviewed her allergies, medications, Medical/Surgical/OB history, and appropriate screenings. There are cats in the home: no. Based on history, this is a/an pregnancy uncomplicated . Her obstetrical history is significant for smoker.  Patient Active Problem List   Diagnosis Date Noted   Supervision of normal first pregnancy 03/15/2024   MDD (major depressive disorder), recurrent severe, without psychosis (HCC) 06/30/2017   Suicidal ideation 06/30/2017   MDD (major depressive disorder) 06/30/2017    Concerns addressed today:   Delivery Plans:  Plans to deliver at [facility].  Anatomy US  Explained first scheduled US  will be 03/29/24. Anatomy US  will be scheduled around [redacted] weeks gestational age.  Labs Discussed genetic screening with patient. Patient would like to do genetic testing to be drawn at new OB visit. Discussed possible labs to be drawn at new OB appointment.  COVID Vaccine Patient has not had COVID vaccine.   Social Determinants of Health Food Insecurity: expresses food insecurity. Information given on local food banks. WIC Referral: Patient is interested in referral to [facility].  Transportation: Patient expressed transportation needs. Childcare: Discussed no children allowed at ultrasound appointments.   First visit review I reviewed new OB appt with pt. I explained she will have blood work and pap smear/pelvic exam if indicated. Explained pt will be seen by Zelda hummer CNM at first visit; encounter routed to appropriate provider.  Pt did express thoughts of self harm states she dose not have a plan she has struggled with this before has in facilities from about 12 to 17 she states she is not on  medication as it makes her have a foggy mind. Rha information website and number given to pt. Pt expressed she will reach out if she needs anything.     Annalee VEAR Sanders, CMA 03/15/2024  11:52 AM

## 2024-03-15 NOTE — Progress Notes (Signed)
 New OB Intake  I connected with  Jacqueline Whitehead on 03/15/24 at 11:15 AM EDT by here in the office she came in for this apt.   I explained I am completing New OB Intake today. We discussed her EDD of 10/26/2024 that is based on LMP of 01/20/2024. Pt is G1/P0. I reviewed her allergies, medications, Medical/Surgical/OB history, and appropriate screenings. There are cats in the home: no. Based on history, this is a/an pregnancy uncomplicated . Her obstetrical history is significant for smoker.  Patient Active Problem List   Diagnosis Date Noted   Supervision of normal first pregnancy 03/15/2024   MDD (major depressive disorder), recurrent severe, without psychosis (HCC) 06/30/2017   Suicidal ideation 06/30/2017   MDD (major depressive disorder) 06/30/2017    Concerns addressed today:   Delivery Plans:  Plans to deliver at [facility].  Anatomy US  Explained first scheduled US  will be 03/29/24. Anatomy US  will be scheduled around [redacted] weeks gestational age.  Labs Discussed genetic screening with patient. Patient would like to do genetic testing to be drawn at new OB visit. Discussed possible labs to be drawn at new OB appointment.  COVID Vaccine Patient has not had COVID vaccine.   Social Determinants of Health Food Insecurity: expresses food insecurity. Information given on local food banks. WIC Referral: Patient is interested in referral to [facility]  Transportation: Patient expressed transportation needs. Childcare: Discussed no children allowed at ultrasound appointments.   First visit review I reviewed new OB appt with pt. I explained she will have blood work and pap smear/pelvic exam if indicated. Explained pt will be seen by Zelda hummer CNM at first visit; encounter routed to appropriate provider.  Pt did express thoughts of self harm states she dose not have a plan she has struggled with this before has in facilities from about 12 to 17 she states she is not on  medication as it makes her have a foggy mind. Rha information website and number given to pt. Pt expressed she will reach out if she needs anything.     Jacqueline Whitehead, CMA 03/15/2024  11:47 AM

## 2024-03-29 ENCOUNTER — Ambulatory Visit: Payer: MEDICAID

## 2024-03-29 DIAGNOSIS — Z3A09 9 weeks gestation of pregnancy: Secondary | ICD-10-CM

## 2024-03-29 DIAGNOSIS — Z3687 Encounter for antenatal screening for uncertain dates: Secondary | ICD-10-CM

## 2024-03-29 DIAGNOSIS — Z3400 Encounter for supervision of normal first pregnancy, unspecified trimester: Secondary | ICD-10-CM

## 2024-04-19 ENCOUNTER — Ambulatory Visit: Payer: MEDICAID | Admitting: Certified Nurse Midwife

## 2024-04-19 ENCOUNTER — Other Ambulatory Visit (HOSPITAL_COMMUNITY)
Admission: RE | Admit: 2024-04-19 | Discharge: 2024-04-19 | Disposition: A | Discharge status: Home / Self Care | Payer: MEDICAID | Source: Ambulatory Visit | Attending: Certified Nurse Midwife | Admitting: Certified Nurse Midwife

## 2024-04-19 ENCOUNTER — Encounter: Payer: Self-pay | Admitting: Certified Nurse Midwife

## 2024-04-19 VITALS — BP 110/72 | HR 81 | Wt 179.5 lb

## 2024-04-19 DIAGNOSIS — Z3401 Encounter for supervision of normal first pregnancy, first trimester: Secondary | ICD-10-CM | POA: Insufficient documentation

## 2024-04-19 DIAGNOSIS — Z124 Encounter for screening for malignant neoplasm of cervix: Secondary | ICD-10-CM

## 2024-04-19 DIAGNOSIS — Z0283 Encounter for blood-alcohol and blood-drug test: Secondary | ICD-10-CM

## 2024-04-19 DIAGNOSIS — Z3A12 12 weeks gestation of pregnancy: Secondary | ICD-10-CM

## 2024-04-19 DIAGNOSIS — Z1379 Encounter for other screening for genetic and chromosomal anomalies: Secondary | ICD-10-CM

## 2024-04-19 DIAGNOSIS — Z113 Encounter for screening for infections with a predominantly sexual mode of transmission: Secondary | ICD-10-CM

## 2024-04-19 DIAGNOSIS — Z130 Encounter for screening for diseases of the blood and blood-forming organs and certain disorders involving the immune mechanism: Secondary | ICD-10-CM

## 2024-04-19 DIAGNOSIS — T7589XA Other specified effects of external causes, initial encounter: Secondary | ICD-10-CM

## 2024-04-19 DIAGNOSIS — Z0184 Encounter for antibody response examination: Secondary | ICD-10-CM

## 2024-04-19 MED ORDER — ASPIRIN 81 MG PO TBEC: 162.0000 mg | DELAYED_RELEASE_TABLET | Freq: Every day | ORAL | 12 refills | Status: DC

## 2024-04-19 NOTE — Patient Instructions (Signed)
 Prenatal Care Prenatal care is health care you get when pregnant. It helps you and your unborn baby stay as healthy as possible. Start prenatal care early in your pregnancy and continue to go to visits during your pregnancy. Prenatal care may be given by a midwife, a family practice doctor, a Publishing rights manager, physician assistant, or a childbirth and pregnancy doctor. What are the benefits of prenatal care? In prenatal care, your health care provider will get to know your medical history. You'll be checked for conditions that might affect you and your baby. Prenatal care will: Lower the risk for problems as your child grows. Lower certain risks for your baby, especially the risk that: Your child may be born early. Your child will have a low weight at birth. What can I expect at the first prenatal care visit? Your first visit will likely be the longest. You should ask to be seen as soon as you know you're pregnant. The first visit is a good time to talk about any questions or concerns. Make a list of questions to ask your provider at your visits. Medical history At your visit, you and your provider will talk about your medical history, including: Your family's medical history and the medical history of the baby's father. Any past pregnancies and long-term (chronic) health conditions. Any surgeries or procedures you have had. All medicines you're taking. Tell them if you're taking herbs or supplements too. Any tobacco, alcohol, or drug use. Other problems that may harm you and your baby. Tell them if: You need food or housing. You have been around chemicals or radiation. Your partner yells at you, hits you, or hurts you. Tests and screenings Your provider will: Do a physical exam, including a pelvic and breast exam. Do tests to check for: Urinary tract infection (UTI). Sexually transmitted infections (STIs). Low iron levels in your blood. This is called anemia. Blood type and certain  proteins on red blood cells called Rh antibodies. Infections and immunity to viruses, such as hepatitis B and rubella. HIV. Ask your provider if you need to be checked for genetic diseases. Tips about staying healthy Your provider will also give you information about how to keep yourself and your baby healthy, including: Nutrition, vitamins, and food safety. Physical activity. How to treat some problems, such as morning sickness. How to avoid infections and substances that may harm your baby. Caring for your teeth. Work and travel. Problems that require you to call your provider. How often will I have prenatal care visits? After your first prenatal care visit, you will have regular visits throughout your pregnancy. You may visit your provider as follows: Up to week 28 of pregnancy: once every 4 weeks. 28-36 weeks: once every 2 weeks. After 36 weeks: every week until delivery. Some people may have more visits. Others may have fewer. It all depends on your health and that of your baby. Keep all prenatal visits. This is one way for you and your baby to stay as healthy as possible. What happens during routine prenatal care visits? Your provider will: Check your weight and blood pressure. Check your baby's heart sounds. Ask questions about your diet, exercise, sleeping patterns, and whether you can feel the baby move. Ask about any pregnancy symptoms you're having and how you're dealing with them. Tell your provider if: You throw up or feel like you may throw up. You have discharge or you bleed from your vagina. You have trouble pooping (constipation). You have swelling, headaches, or trouble  seeing. You are very tired, or you feel sad and anxious all the time. You have discomfort, including back pain or pain in the pelvis. Tell you problems to watch for during your pregnancy, including signs of labor. Measure the height of your uterus in your belly. This is called fundal height. What  tests might I have during prenatal care visits? You may have blood, urine, and imaging tests. These may include: Urine tests to check for blood sugar, protein, or signs of infection. Genetic testing. Ultrasounds to check your baby's growth, development, and well-being. Your baby may also be checked for congenital conditions. Glucose tests to check for gestational diabetes. This is a form of diabetes that a person can get when pregnant. A test to check for group B strep (GBS) infection. What else can I expect during prenatal care visits? Your provider may give you some vaccines. Getting certain vaccines during pregnancy can protect your baby after birth. These may include: A flu shot. Tdap (tetanus, diphtheria, pertussis) vaccine. A COVID-19 vaccine. A RSV vaccine. Later in your pregnancy, your provider may talk to you about: Childbirth and childbirth classes. Breastfeeding and breastfeeding classes. Birth control after your baby is born. Where to find more information Office on Women's Health: TravelLesson.ca American Pregnancy Association: americanpregnancy.org March of Dimes: marchofdimes.org This information is not intended to replace advice given to you by your health care provider. Make sure you discuss any questions you have with your health care provider. Document Revised: 12/30/2022 Document Reviewed: 12/30/2022 Elsevier Patient Education  2024 [employer].
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
## 2024-04-20 LAB — URINALYSIS, ROUTINE W REFLEX MICROSCOPIC
Bilirubin, UA: NEGATIVE
Glucose, UA: NEGATIVE
Ketones, UA: NEGATIVE
Leukocytes,UA: NEGATIVE
Nitrite, UA: NEGATIVE
Protein,UA: NEGATIVE
RBC, UA: NEGATIVE
Specific Gravity, UA: 1.018 (ref 1.005–1.030)
Urobilinogen, Ur: 0.2 mg/dL (ref 0.2–1.0)
pH, UA: 8.5 — ABNORMAL HIGH (ref 5.0–7.5)

## 2024-04-20 LAB — CERVICOVAGINAL ANCILLARY ONLY
Chlamydia: NEGATIVE
Comment: NEGATIVE
Comment: NORMAL
Neisseria Gonorrhea: NEGATIVE

## 2024-04-20 LAB — CBC/D/PLT+RPR+RH+ABO+RUBIGG...
Antibody Screen: NEGATIVE
Basophils Absolute: 0 x10E3/uL (ref 0.0–0.2)
Basos: 1 %
EOS (ABSOLUTE): 0.1 x10E3/uL (ref 0.0–0.4)
Eos: 1 %
HCV Ab: NONREACTIVE
HIV Screen 4th Generation wRfx: NONREACTIVE
Hematocrit: 33.3 % — ABNORMAL LOW (ref 34.0–46.6)
Hemoglobin: 11.3 g/dL (ref 11.1–15.9)
Hepatitis B Surface Ag: NEGATIVE
Immature Grans (Abs): 0 x10E3/uL (ref 0.0–0.1)
Immature Granulocytes: 0 %
Lymphocytes Absolute: 1.5 x10E3/uL (ref 0.7–3.1)
Lymphs: 27 %
MCH: 31.3 pg (ref 26.6–33.0)
MCHC: 33.9 g/dL (ref 31.5–35.7)
MCV: 92 fL (ref 79–97)
Monocytes Absolute: 0.3 x10E3/uL (ref 0.1–0.9)
Monocytes: 5 %
Neutrophils Absolute: 3.6 x10E3/uL (ref 1.4–7.0)
Neutrophils: 66 %
Platelets: 228 x10E3/uL (ref 150–450)
RBC: 3.61 x10E6/uL — ABNORMAL LOW (ref 3.77–5.28)
RDW: 12.6 % (ref 11.7–15.4)
RPR Ser Ql: NONREACTIVE
Rh Factor: POSITIVE
Rubella Antibodies, IGG: 1.34 {index} (ref >0.99)
Varicella zoster IgG: REACTIVE
WBC: 5.5 x10E3/uL (ref 3.4–10.8)

## 2024-04-20 LAB — HCV INTERPRETATION

## 2024-04-21 LAB — CULTURE, OB URINE

## 2024-04-21 LAB — URINE CULTURE, OB REFLEX

## 2024-04-22 LAB — MONITOR DRUG PROFILE 14(MW)
Amphetamine Scrn, Ur: NEGATIVE ng/mL
BARBITURATE SCREEN URINE: NEGATIVE ng/mL
BENZODIAZEPINE SCREEN, URINE: NEGATIVE ng/mL
Buprenorphine, Urine: NEGATIVE ng/mL
Cocaine (Metab) Scrn, Ur: NEGATIVE ng/mL
Creatinine(Crt), U: 66.7 mg/dL (ref 20.0–300.0)
Fentanyl, Urine: NEGATIVE pg/mL
Meperidine Screen, Urine: NEGATIVE ng/mL
Methadone Screen, Urine: NEGATIVE ng/mL
OXYCODONE+OXYMORPHONE UR QL SCN: NEGATIVE ng/mL
Opiate Scrn, Ur: NEGATIVE ng/mL
Ph of Urine: 8.1 (ref 4.5–8.9)
Phencyclidine Qn, Ur: NEGATIVE ng/mL
Propoxyphene Scrn, Ur: NEGATIVE ng/mL
SPECIFIC GRAVITY: 1.023
Tramadol Screen, Urine: NEGATIVE ng/mL

## 2024-04-22 LAB — CANNABINOID (GC/MS), URINE
Cannabinoid: POSITIVE — AB
Carboxy THC (GC/MS): 422 ng/mL

## 2024-04-22 LAB — NICOTINE SCREEN, URINE: Cotinine Ql Scrn, Ur: POSITIVE ng/mL — AB

## 2024-04-23 LAB — MATERNIT 21 PLUS CORE, BLOOD
Fetal Fraction: 27
Result (T21): NEGATIVE
Trisomy 13 (Patau syndrome): NEGATIVE
Trisomy 18 (Edwards syndrome): NEGATIVE
Trisomy 21 (Down syndrome): NEGATIVE

## 2024-04-23 LAB — CYTOLOGY - PAP: Diagnosis: NEGATIVE

## 2024-05-18 ENCOUNTER — Other Ambulatory Visit: Payer: Self-pay | Admitting: Licensed Practical Nurse

## 2024-05-18 DIAGNOSIS — O0992 Supervision of high risk pregnancy, unspecified, second trimester: Secondary | ICD-10-CM

## 2024-05-18 DIAGNOSIS — Z3A17 17 weeks gestation of pregnancy: Secondary | ICD-10-CM

## 2024-05-18 DIAGNOSIS — Z369 Encounter for antenatal screening, unspecified: Secondary | ICD-10-CM

## 2024-05-18 NOTE — Progress Notes (Signed)
 MFM referral placed for twin pregnancy  Jinnie Cookey, [place]  [facility] 05/18/24  1:11 PM

## 2024-05-27 ENCOUNTER — Telehealth: Payer: Self-pay

## 2024-06-02 ENCOUNTER — Ambulatory Visit: Payer: MEDICAID

## 2024-06-02 ENCOUNTER — Ambulatory Visit: Payer: MEDICAID | Attending: Maternal & Fetal Medicine

## 2024-06-02 DIAGNOSIS — O0992 Supervision of high risk pregnancy, unspecified, second trimester: Secondary | ICD-10-CM

## 2024-06-02 DIAGNOSIS — O30043 Twin pregnancy, dichorionic/diamniotic, third trimester: Secondary | ICD-10-CM | POA: Insufficient documentation

## 2024-06-02 DIAGNOSIS — O30032 Twin pregnancy, monochorionic/diamniotic, second trimester: Secondary | ICD-10-CM

## 2024-06-02 DIAGNOSIS — F419 Anxiety disorder, unspecified: Secondary | ICD-10-CM | POA: Diagnosis not present

## 2024-06-02 DIAGNOSIS — Z3A19 19 weeks gestation of pregnancy: Secondary | ICD-10-CM

## 2024-06-02 DIAGNOSIS — Z369 Encounter for antenatal screening, unspecified: Secondary | ICD-10-CM

## 2024-06-02 DIAGNOSIS — O30002 Twin pregnancy, unspecified number of placenta and unspecified number of amniotic sacs, second trimester: Secondary | ICD-10-CM | POA: Insufficient documentation

## 2024-06-02 DIAGNOSIS — O99342 Other mental disorders complicating pregnancy, second trimester: Secondary | ICD-10-CM | POA: Diagnosis not present

## 2024-06-02 DIAGNOSIS — Z3A17 17 weeks gestation of pregnancy: Secondary | ICD-10-CM

## 2024-06-02 DIAGNOSIS — O30042 Twin pregnancy, dichorionic/diamniotic, second trimester: Secondary | ICD-10-CM

## 2024-06-02 DIAGNOSIS — O43192 Other malformation of placenta, second trimester: Secondary | ICD-10-CM | POA: Diagnosis not present

## 2024-06-22 ENCOUNTER — Telehealth: Payer: Self-pay

## 2024-06-22 NOTE — Telephone Encounter (Signed)
 Contacted patient via phone, no answer, left voicemail asking the patient to contact the office.

## 2024-06-22 NOTE — Telephone Encounter (Signed)
 Left generic voicemail requesting return call.

## 2024-06-25 NOTE — Progress Notes (Unsigned)
    Return Prenatal Note   Subjective   23 y.o. G1P0000 at [redacted]w[redacted]d presents for this follow-up prenatal visit.  Patient  Patient reports:doing well, mood has been good. Having some rib pain, especially when standing for a long time at work.  -considering PLTCS, she is concerned about labor pain and that is could trigger a panic attack, does not intend to have additional children.  -working with her insurance to get breast [pump  -may not take CBE as the times available do not work for her   Movement: Present Contractions: Not present  Objective   Flow sheet Vitals: Pulse Rate: 85 BP: 111/75 Fetal Heart Rate (bpm): 140, 146 Total weight gain: 16 lb 6.4 oz (7.439 kg)  General Appearance  No acute distress, well appearing, and well nourished Pulmonary   Normal work of breathing Neurologic   Alert and oriented to person, place, and time Psychiatric   Mood and affect within normal limits   Assessment/Plan   Plan  23 y.o. G1P0000 at [redacted]w[redacted]d presents for follow-up OB visit. Reviewed prenatal record including previous visit note.  Supervision of normal first pregnancy -TWG 16lbs, which is ok, eats 2 meals and snacks  -encouraged to learn about the labor process -warning signs reviewed   Twin pregnancy in second trimester -US   9/24 Twin A: 44% Twin B: 80% Discordance is 6.5% Marginal cord insertion noted on TWin A. . -next US  10/27 -pt considering PLTCS for birth, rec seeing MD to discuss       No orders of the defined types were placed in this encounter.  No follow-ups on file.   Future Appointments  Date Time Provider Department Center  07/05/2024  9:00 AM [facility]    For next visit:  continue with routine prenatal care     Azaylea Maves [facility], CNM  10/20/251:02 PM

## 2024-06-28 ENCOUNTER — Ambulatory Visit: Payer: MEDICAID | Admitting: Licensed Practical Nurse

## 2024-06-28 ENCOUNTER — Telehealth: Payer: Self-pay | Admitting: Licensed Practical Nurse

## 2024-06-28 ENCOUNTER — Telehealth: Payer: Self-pay | Admitting: Licensed Clinical Social Worker

## 2024-06-28 VITALS — BP 111/75 | HR 85 | Wt 185.4 lb

## 2024-06-28 DIAGNOSIS — O30002 Twin pregnancy, unspecified number of placenta and unspecified number of amniotic sacs, second trimester: Secondary | ICD-10-CM

## 2024-06-28 DIAGNOSIS — Z3A22 22 weeks gestation of pregnancy: Secondary | ICD-10-CM | POA: Diagnosis not present

## 2024-06-28 DIAGNOSIS — O0992 Supervision of high risk pregnancy, unspecified, second trimester: Secondary | ICD-10-CM

## 2024-06-28 NOTE — Assessment & Plan Note (Signed)
-  US   9/24 Twin A: 44% Twin B: 80% Discordance is 6.5% Marginal cord insertion noted on TWin A. . -next US  10/27 -pt considering PLTCS for birth, rec seeing MD to discuss

## 2024-06-28 NOTE — Telephone Encounter (Signed)
 Contacted the patient left voicemail for the patient to call back. The patient needs an 4 week Routine office visit with an 1 hour glucose appointment.

## 2024-06-28 NOTE — Assessment & Plan Note (Addendum)
-  TWG 16lbs, which is ok, eats 2 meals and snacks  -encouraged to learn about the labor process -warning signs reviewed

## 2024-06-28 NOTE — Telephone Encounter (Signed)
I contacted patient via phone. I left voicemail for patient to call back to be scheduled.   

## 2024-06-28 NOTE — Telephone Encounter (Signed)
-----   Message from Andriette DELENA Lambing sent at 06/24/2024 11:51 AM EDT ----- Regarding: Mental Health Referral Hello,  I just spoke with [place]. She reports being diagnosed with anxiety and depression when she was 23 y/o. She reports no current medication. She is pregnant with twins and due 10/26/2024. There is financial tension between her and the father of the baby. She reports that he has been unemployed for a couple of months so she has been stressed about money. Member reports wanting to speak with someone. Please reach out to her at [phone].  Thank you for all that you do, Jenna Brackman

## 2024-07-01 NOTE — Telephone Encounter (Signed)
I contacted patient via phone. I left voicemail for patient to call back to be scheduled.   

## 2024-07-05 ENCOUNTER — Other Ambulatory Visit: Payer: Self-pay | Admitting: Maternal & Fetal Medicine

## 2024-07-05 ENCOUNTER — Ambulatory Visit: Payer: MEDICAID | Attending: Maternal & Fetal Medicine

## 2024-07-05 DIAGNOSIS — O43192 Other malformation of placenta, second trimester: Secondary | ICD-10-CM | POA: Diagnosis not present

## 2024-07-05 DIAGNOSIS — O30042 Twin pregnancy, dichorionic/diamniotic, second trimester: Secondary | ICD-10-CM | POA: Insufficient documentation

## 2024-07-05 DIAGNOSIS — Z362 Encounter for other antenatal screening follow-up: Secondary | ICD-10-CM | POA: Insufficient documentation

## 2024-07-05 DIAGNOSIS — O43199 Other malformation of placenta, unspecified trimester: Secondary | ICD-10-CM | POA: Insufficient documentation

## 2024-07-05 DIAGNOSIS — Z3A23 23 weeks gestation of pregnancy: Secondary | ICD-10-CM | POA: Insufficient documentation

## 2024-07-12 ENCOUNTER — Other Ambulatory Visit: Payer: Self-pay

## 2024-07-12 DIAGNOSIS — O30032 Twin pregnancy, monochorionic/diamniotic, second trimester: Secondary | ICD-10-CM

## 2024-07-21 ENCOUNTER — Other Ambulatory Visit: Payer: Self-pay

## 2024-07-21 ENCOUNTER — Emergency Department
Admission: EM | Admit: 2024-07-21 | Discharge: 2024-07-21 | Disposition: A | Discharge status: Home / Self Care | Payer: MEDICAID | Attending: Emergency Medicine | Admitting: Emergency Medicine

## 2024-07-21 DIAGNOSIS — Z3A26 26 weeks gestation of pregnancy: Secondary | ICD-10-CM | POA: Insufficient documentation

## 2024-07-21 DIAGNOSIS — R55 Syncope and collapse: Secondary | ICD-10-CM | POA: Diagnosis not present

## 2024-07-21 DIAGNOSIS — O26892 Other specified pregnancy related conditions, second trimester: Secondary | ICD-10-CM | POA: Insufficient documentation

## 2024-07-21 LAB — CBC
HCT: 27.4 % — ABNORMAL LOW (ref 36.0–46.0)
Hemoglobin: 9 g/dL — ABNORMAL LOW (ref 12.0–15.0)
MCH: 28.8 pg (ref 26.0–34.0)
MCHC: 32.8 g/dL (ref 30.0–36.0)
MCV: 87.5 fL (ref 80.0–100.0)
Platelets: 174 K/uL (ref 150–400)
RBC: 3.13 MIL/uL — ABNORMAL LOW (ref 3.87–5.11)
RDW: 12.8 % (ref 11.5–15.5)
WBC: 7.3 K/uL (ref 4.0–10.5)
nRBC: 0 % (ref 0.0–0.2)

## 2024-07-21 LAB — URINALYSIS, ROUTINE W REFLEX MICROSCOPIC
Bilirubin Urine: NEGATIVE
Glucose, UA: NEGATIVE mg/dL
Hgb urine dipstick: NEGATIVE
Ketones, ur: NEGATIVE mg/dL
Leukocytes,Ua: NEGATIVE
Nitrite: NEGATIVE
Protein, ur: 100 mg/dL — AB
Specific Gravity, Urine: 1.016 (ref 1.005–1.030)
pH: 7 (ref 5.0–8.0)

## 2024-07-21 LAB — COMPREHENSIVE METABOLIC PANEL WITH GFR
ALT: <5 U/L (ref 0–44)
AST: 29 U/L (ref 15–41)
Albumin: 3.7 g/dL (ref 3.5–5.0)
Alkaline Phosphatase: 51 U/L (ref 38–126)
Anion gap: 10 (ref 5–15)
BUN: 5 mg/dL — ABNORMAL LOW (ref 6–20)
CO2: 23 mmol/L (ref 22–32)
Calcium: 8.6 mg/dL — ABNORMAL LOW (ref 8.9–10.3)
Chloride: 104 mmol/L (ref 98–111)
Creatinine, Ser: 0.54 mg/dL (ref 0.44–1.00)
GFR, Estimated: >60 mL/min (ref >60)
Glucose, Bld: 154 mg/dL — ABNORMAL HIGH (ref 70–99)
Potassium: 3.6 mmol/L (ref 3.5–5.1)
Sodium: 137 mmol/L (ref 135–145)
Total Bilirubin: 0.7 mg/dL (ref 0.0–1.2)
Total Protein: 6.2 g/dL — ABNORMAL LOW (ref 6.5–8.1)

## 2024-07-21 LAB — POC URINE PREG, ED: Preg Test, Ur: POSITIVE — AB

## 2024-07-21 NOTE — ED Triage Notes (Signed)
 See first nurse note.

## 2024-07-21 NOTE — ED Provider Notes (Signed)
 [facility] Provider Note    Event Date/Time   First MD Initiated Contact with Patient 07/21/24 1231     (approximate)  History   Chief Complaint: Loss of Consciousness  HPI  Jacqueline Whitehead is a 23 y.o. female  approximately [redacted] weeks pregnant with twins who presents to the emergency department after a brief syncopal episode.  According to the patient she began feeling somewhat lightheaded and dizzy she sat down in a chair but did briefly lose consciousness, did not fall to the ground coworker tried to help her up and she briefly lost consciousness again.  Patient states a history of syncopal episodes in the past.  States during the pregnancy she has been very lightheaded at times and is almost passed out multiple times.  Patient denies any chest pain or shortness of breath.  Patient denies any symptoms whatsoever currently.    Physical Exam   Triage Vital Signs: ED Triage Vitals  Encounter Vitals Group     BP 07/21/24 1107 111/79     Girls Systolic BP Percentile --      Girls Diastolic BP Percentile --      Boys Systolic BP Percentile --      Boys Diastolic BP Percentile --      Pulse Rate 07/21/24 1107 (!) 107     Resp 07/21/24 1107 18     Temp 07/21/24 1107 97.7 F (36.5 C)     Temp Source 07/21/24 1107 Oral     SpO2 07/21/24 1107 99 %     Weight 07/21/24 1105 185 lb (83.9 kg)     Height 07/21/24 1105 5' 5 (1.651 m)     Head Circumference --      Peak Flow --      Pain Score 07/21/24 1105 0     Pain Loc --      Pain Education --      Exclude from Growth Chart --     Most recent vital signs: Vitals:   07/21/24 1107  BP: 111/79  Pulse: (!) 107  Resp: 18  Temp: 97.7 F (36.5 C)  SpO2: 99%    General: Awake, no distress.  CV:  Good peripheral perfusion.  Regular rate and rhythm  Resp:  Normal effort.  Equal breath sounds bilaterally.  Abd:  Gravid abdomen.  Nontender to palpation.  ED Results / Procedures / Treatments    EKG  EKG viewed and interpreted by myself shows sinus tachycardia at 106 bpm the narrow QRS, normal axis, normal intervals, no concerning ST changes.  MEDICATIONS ORDERED IN ED: Medications - No data to display   IMPRESSION / MDM / ASSESSMENT AND PLAN / ED COURSE  I reviewed the triage vital signs and the nursing notes.  Patient's presentation is most consistent with acute presentation with potential threat to life or bodily function.  Patient presents emergency department for a syncopal episode while at work.  Patient is pregnant with twins currently [redacted] weeks pregnant.  Patient has a history of syncopal episodes in the past and is states near syncope multiple times during this pregnancy.  Patient has bedside ultrasound performed by myself shows 2 fetuses, heart rate on the first 1 of 146 heart rate on the second 1-148.  Good fetal movement.  Patient denies any symptoms currently.  Patient's lab work is reassuring with a normal CBC and normal chemistry.  Urinalysis is pending although the patient denies any urinary symptoms.  I discussed with the patient  the importance of significant hydration.  I also discussed with the patient lying down onto her left side if she is feeling weak or lightheaded at any point.  Patient denies any chest pain or shortness of breath.  Vital signs are reassuring.  Will have the patient follow-up with her OB/GYN.  Patient agreeable to plan of care.  FINAL CLINICAL IMPRESSION(S) / ED DIAGNOSES   Syncope during pregnancy    Note:  This document was prepared using Dragon voice recognition software and may include unintentional dictation errors.   Dorothyann Drivers, MD 07/21/24 [phone]

## 2024-07-21 NOTE — ED Triage Notes (Signed)
 Patient is [redacted] weeks pregnant. While at work became dizzy and sat down. Staff reports patient passed out three times.  EMS vitals: 130/89 b/p 102HR  EMS reports A&O x4 upon arrival. Seen at [facility].

## 2024-07-21 NOTE — Discharge Instructions (Signed)
 As we discussed please drink plenty fluids.  If you feel lightheaded please lay down on your left side.  Please follow-up with your OB/GYN.  Return to the emergency department for any further episodes of lightheadedness/syncope, any chest pain trouble breathing or any other symptom personally concerning to yourself.

## 2024-07-26 ENCOUNTER — Ambulatory Visit: Payer: MEDICAID | Admitting: Registered Nurse

## 2024-07-26 VITALS — BP 109/77 | HR 92 | Wt 190.0 lb

## 2024-07-26 DIAGNOSIS — Z3A26 26 weeks gestation of pregnancy: Secondary | ICD-10-CM

## 2024-07-26 DIAGNOSIS — O30042 Twin pregnancy, dichorionic/diamniotic, second trimester: Secondary | ICD-10-CM | POA: Diagnosis not present

## 2024-07-26 DIAGNOSIS — Z23 Encounter for immunization: Secondary | ICD-10-CM | POA: Diagnosis not present

## 2024-07-26 NOTE — Progress Notes (Signed)
    Return Prenatal Note   Subjective   23 y.o. G1P0000 at [redacted]w[redacted]d presents for this follow-up prenatal visit.  Patient says babies are moving well. Mood is fine. Not taking any current psych meds. Isn't seeing a counselor or therapist, currently. Needs Rx for breast pump. Not taking ASA Haven't felt the need for it. Passed out at work the other day. When [employer] got there, they took her blood pressure and it was 179/108. She was sent to the hospital and monitored there. Blood pressures have all been normal since that time. Patient now has a stool to sit on at work and has felt fine.  Patient reports: Movement: Present Contractions: Not present  Objective   Flow sheet Vitals: Pulse Rate: 92 BP: 109/77 Fetal Heart Rate (bpm): 150/140 Total weight gain: 21 lb (9.526 kg)  General Appearance  No acute distress, well appearing, and well nourished Pulmonary   Normal work of breathing Neurologic   Alert and oriented to person, place, and time Psychiatric   Mood and affect within normal limits   Assessment/Plan   Plan  23 y.o. G1P0000 at [redacted]w[redacted]d presents for follow-up OB visit. Reviewed prenatal record including previous visit note.  Twin pregnancy in second trimester Strongly encouraged ASA 162 mg every day for preeclampsia prevention. She said she didn't understand that the ASA was for this purpose. Agrees to start taking now. Last growth ultrasound was on 10/27 (WNL). Next one is scheduled on 11/24.  Answered questions re: MOD. Uncertain whether she'd want to attempt a vaginal birth even if fetuses are optimally positioned. Discussed typical delivery around 38 wks for uncomplicated di/di twins. Will continue to discuss MOD and timing as pregnancy progresses. RTC 2 wks for 28 wk labs. Should also have iron studies with twin pregnancy. Will send breast pump Rx to [facility].      Orders Placed This Encounter  Procedures   28 Week RH+Panel    Standing Status:   Future     Expected Date:   08/09/2024    Expiration Date:   07/26/2025   Fe+TIBC+Fer    Standing Status:   Future    Expected Date:   08/09/2024    Expiration Date:   07/26/2025   B12    Standing Status:   Future    Expected Date:   08/09/2024    Expiration Date:   07/26/2025   No follow-ups on file.   Future Appointments  Date Time Provider Department Center  08/02/2024  3:00 PM [facility]  08/09/2024 10:35 AM Justino Eleanor HERO, CNM [facility] None  08/30/2024 11:00 AM [facility]    For next visit:  ROB with 1 hour glucola, third trimester labs, and Tdap     Lauraine Lakes, CNM  07/26/24 10:42 AM

## 2024-07-26 NOTE — Assessment & Plan Note (Addendum)
 Strongly encouraged ASA 162 mg every day for preeclampsia prevention. She said she didn't understand that the ASA was for this purpose. Agrees to start taking now. Last growth ultrasound was on 10/27 (WNL). Next one is scheduled on 11/24.  Answered questions re: MOD. Uncertain whether she'd want to attempt a vaginal birth even if fetuses are optimally positioned. Discussed typical delivery around 38 wks for uncomplicated di/di twins. Will continue to discuss MOD and timing as pregnancy progresses. RTC 2 wks for 28 wk labs. Should also have iron studies with twin pregnancy. Will send breast pump Rx to [facility].

## 2024-08-02 ENCOUNTER — Ambulatory Visit (HOSPITAL_BASED_OUTPATIENT_CLINIC_OR_DEPARTMENT_OTHER): Payer: MEDICAID | Admitting: Maternal & Fetal Medicine

## 2024-08-02 ENCOUNTER — Ambulatory Visit: Payer: MEDICAID | Attending: Maternal & Fetal Medicine

## 2024-08-02 DIAGNOSIS — O30042 Twin pregnancy, dichorionic/diamniotic, second trimester: Secondary | ICD-10-CM

## 2024-08-02 DIAGNOSIS — O321XX0 Maternal care for breech presentation, not applicable or unspecified: Secondary | ICD-10-CM | POA: Insufficient documentation

## 2024-08-02 DIAGNOSIS — O30032 Twin pregnancy, monochorionic/diamniotic, second trimester: Secondary | ICD-10-CM | POA: Diagnosis not present

## 2024-08-02 DIAGNOSIS — Z3A27 27 weeks gestation of pregnancy: Secondary | ICD-10-CM | POA: Diagnosis not present

## 2024-08-02 DIAGNOSIS — O43192 Other malformation of placenta, second trimester: Secondary | ICD-10-CM | POA: Diagnosis not present

## 2024-08-02 NOTE — Progress Notes (Unsigned)
   Patient information  Patient Name: Jacqueline Whitehead  Patient MRN:   [id number]  Referring practice: MFM Referring Provider: Belle SHIPPER  Problem List   Patient Active Problem List   Diagnosis Date Noted   Twin pregnancy in second trimester 06/02/2024   Supervision of normal first pregnancy 03/15/2024    Maternal Fetal medicine Consult  Geneveive E. Mccomber is a 23 y.o. G1P0000 at [redacted]w[redacted]d here for ultrasound and consultation. Abbe E. Diebel is doing well today with no acute concerns. Today we focused on the following:   ***: ***  ***: ***   The patient had time to ask questions that were answered to her satisfaction.  She verbalized understanding and agrees to proceed with the plan below.  Recommendations -Serial growth ultrasounds every 4-6 weeks until delivery -Antenatal testing to start around 32 weeks  -Delivery around *** weeks gestation  I spent *** minutes reviewing the patients chart, including labs and images as well as counseling the patient about her medical conditions. Greater than 50% of the time was spent in direct face-to-face patient counseling.  Delora Smaller  MFM, [place]   08/02/2024  3:44 PM   Review of Systems: A review of systems was performed and was negative except per HPI   Vitals and Physical Exam    08/02/2024    2:58 PM 07/26/2024   10:00 AM 07/21/2024   11:07 AM  Vitals with BMI  Weight  190 lbs   BMI  31.62   Systolic 124 109 888  Diastolic 81 77 79  Pulse 99 92 107    Sitting comfortably on the sonogram table Nonlabored breathing Normal rate and rhythm Abdomen is nontender  Past pregnancies OB History  Gravida Para Term Preterm AB Living  1 0 0 0 0 0  SAB IAB Ectopic Multiple Live Births  0 0 0 0 0    # Outcome Date GA Lbr Len/2nd Weight Sex Type Anes PTL Lv  1 Current              Future Appointments  Date Time Provider Department Center  08/09/2024 10:35 AM Justino Eleanor [facility], CNM [facility] None  08/30/2024  11:00 AM [facility]

## 2024-08-02 NOTE — Progress Notes (Signed)
   Patient information  Patient Name: Jacqueline Whitehead  Patient MRN:   [id number]  Referring practice: [facility] Referring Provider: Belle SHIPPER  Problem List   Patient Active Problem List   Diagnosis Date Noted   Twin pregnancy in second trimester 06/02/2024   Supervision of normal first pregnancy 03/15/2024   Maternal Fetal medicine Consult  Danene E. Ketelsen is a 23 y.o. G1P0000 at [redacted]w[redacted]d here for ultrasound and consultation. Innocence E. Garms is doing well today with no acute concerns. Today we focused on the following:   Di-di twins: The patient is here for growth ultrasound due to her twin pregnancy.  I discussed that the fetal growth is normal although twin A's measurements have decreased slightly to the 18th percentile, however, the head measurements are not ideal due to poor fetal position and are probably under measured.  Twin B is at the 72nd percentile.  I discussed the typical course for this type of twin pregnancy including growth ultrasounds and antenatal testing starting around 34 to 36 weeks.  The patient had time to ask questions that were answered to her satisfaction.  She verbalized understanding and agrees to proceed with the plan below.  Recommendations -Serial growth ultrasounds every 4-6 weeks until delivery -Antenatal testing to start around 34-36 weeks  -Delivery around [redacted] weeks gestation  I spent 20 minutes reviewing the patients chart, including labs and images as well as counseling the patient about her medical conditions. Greater than 50% of the time was spent in direct face-to-face patient counseling.  Delora Smaller  MFM, [place]   08/02/2024  3:56 PM   Review of Systems: A review of systems was performed and was negative except per HPI   Vitals and Physical Exam    08/02/2024    2:58 PM 07/26/2024   10:00 AM 07/21/2024   11:07 AM  Vitals with BMI  Weight  190 lbs   BMI  31.62   Systolic 124 109 888  Diastolic 81 77 79  Pulse 99 92 107     Sitting comfortably on the sonogram table Nonlabored breathing Normal rate and rhythm Abdomen is nontender  Past pregnancies OB History  Gravida Para Term Preterm AB Living  1 0 0 0 0 0  SAB IAB Ectopic Multiple Live Births  0 0 0 0 0    # Outcome Date GA Lbr Len/2nd Weight Sex Type Anes PTL Lv  1 Current              Future Appointments  Date Time Provider Department Center  08/09/2024 10:35 AM Justino Eleanor HERO, CNM [facility] None  08/30/2024 11:00 AM [facility]

## 2024-08-09 ENCOUNTER — Ambulatory Visit: Payer: MEDICAID | Admitting: Obstetrics

## 2024-08-09 ENCOUNTER — Encounter: Payer: Self-pay | Admitting: Obstetrics

## 2024-08-09 ENCOUNTER — Other Ambulatory Visit: Payer: MEDICAID

## 2024-08-09 VITALS — BP 109/71 | HR 93 | Wt 194.0 lb

## 2024-08-09 DIAGNOSIS — Z3A28 28 weeks gestation of pregnancy: Secondary | ICD-10-CM | POA: Diagnosis not present

## 2024-08-09 DIAGNOSIS — Z23 Encounter for immunization: Secondary | ICD-10-CM

## 2024-08-09 DIAGNOSIS — O30043 Twin pregnancy, dichorionic/diamniotic, third trimester: Secondary | ICD-10-CM | POA: Diagnosis not present

## 2024-08-09 DIAGNOSIS — O30042 Twin pregnancy, dichorionic/diamniotic, second trimester: Secondary | ICD-10-CM

## 2024-08-09 DIAGNOSIS — Z3403 Encounter for supervision of normal first pregnancy, third trimester: Secondary | ICD-10-CM

## 2024-08-09 NOTE — Assessment & Plan Note (Addendum)
-  Growth scans q 4 weeks. Scheduled with MFM 08/30/24 -Start weekly antenatal testing at 34-36 weeks -Prefers a CS/consider BTL. Will schedule next visit with MD

## 2024-08-09 NOTE — Assessment & Plan Note (Addendum)
-  28-week labs, TDaP, iron panel, B12 today -Encouraged healthy eating habits, increased protein and iron, adequate hydration -Recommend splints at night for carpal tunnel symptoms -Undecided about contraception - considering Nexplanon, BTL. Briefly reviewed other options, link sent to [email or web address] -Reviewed kick counts and preterm labor warning signs. Instructed to call office or come to hospital with persistent headache, vision changes, regular contractions, leaking of fluid, decreased fetal movement or vaginal bleeding.

## 2024-08-09 NOTE — Patient Instructions (Signed)
 Diphtheria; Tetanus; Pertussis (DTaP or Tdap) Vaccine Injection What is this medication? DIPHTHERIA; TETANUS; PERTUSSIS VACCINE (dif THEER ee uh; TET n [place]; per TUS iss VAK seen) reduces the risk of diphtheria, tetanus (lockjaw), and pertussis (whooping cough). It does not treat diphtheria, tetanus, or pertussis. It is still possible to get diphtheria, tetanus, or pertussis after receiving this vaccine, but the symptoms may be less severe or not last as long. It works by helping your immune system learn how to fight off a future infection. This medicine may be used for other purposes; ask your health care provider or pharmacist if you have questions. COMMON BRAND NAME(S): Adacel, Boostrix, Certiva, Daptacel, Infanrix, Tripedia What should I tell my care team before I take this medication? They need to know if you have any of these conditions: Blood disorders, such as hemophilia Fever or infection Immune system problems Neurologic disease Seizures An unusual or allergic reaction to other vaccines, latex, other medications, foods, dyes, or preservatives Pregnant or trying to get pregnant Breastfeeding How should I use this medication? This vaccine is injected into a muscle. It is given by your care team. A copy of Vaccine Information Statements will be given before each vaccination. Be sure to read this information carefully each time. This sheet may change often. Talk to your care team about the use of this medication in children. While the DTaP vaccine may be given to children as young as 6 weeks and the Tdap vaccine may be given to children as young as 36 years old, precautions do apply. Overdosage: If you think you have taken too much of this medicine contact a poison control center or emergency room at once. NOTE: This medicine is only for you. Do not share this medicine with others. What if I miss a dose? It is important not to miss your dose. Call your care team if you are unable to keep an  appointment. What may interact with this medication? This medication may interact with the following: Certain medications that prevent or treat blood clots, such as warfarin, enoxaparin, dalteparin Immune globulin Medications that lower your chance of fighting an infection, such as adalimumab, anakinra, infliximab Medications to treat cancer Steroid medications, such as prednisone or cortisone This list may not describe all possible interactions. Give your health care provider a list of all the medicines, herbs, non-prescription drugs, or dietary supplements you use. Also tell them if you smoke, drink alcohol, or use illegal drugs. Some items may interact with your medicine. What should I watch for while using this medication? See your care team for all shots of this vaccine as directed. Report any side effects to your care team right away. This vaccine, like all vaccines, may not fully protect everyone. What side effects may I notice from receiving this medication? Side effects that you should report to your care team as soon as possible: Allergic reactions--skin rash, itching, hives, swelling of the face, lips, tongue, or throat Feeling faint or lightheaded Side effects that usually do not require medical attention (report these to your care team if they continue or are bothersome): Chills Fever General discomfort and fatigue Headache Joint pain Muscle pain Pain, redness, or irritation at injection site This list may not describe all possible side effects. Call your doctor for medical advice about side effects. You may report side effects to FDA at 1-800-FDA-1088. Where should I keep my medication? This vaccine is only given by your care team. It will not be stored at home. NOTE: This  sheet is a summary. It may not cover all possible information. If you have questions about this medicine, talk to your doctor, pharmacist, or health care provider.  2024 Elsevier/Gold Standard  (2022-02-25 00:00:00)

## 2024-08-09 NOTE — Progress Notes (Signed)
    Return Prenatal Note   Assessment/Plan   Plan  23 y.o. G1P0000 at [redacted]w[redacted]d presents for follow-up OB visit. Reviewed prenatal record including previous visit note.  Twin pregnancy in second trimester -Growth scans q 4 weeks. Scheduled with MFM 08/30/24 -Start weekly antenatal testing at 34-36 weeks -Prefers a CS/consider BTL. Will schedule next visit with MD   Supervision of normal first pregnancy -28-week labs, TDaP, iron panel, B12 today -Encouraged healthy eating habits, increased protein and iron, adequate hydration -Recommend splints at night for carpal tunnel symptoms -Undecided about contraception - considering Nexplanon, BTL. Briefly reviewed other options, link sent to [email or web address] -Reviewed kick counts and preterm labor warning signs. Instructed to call office or come to hospital with persistent headache, vision changes, regular contractions, leaking of fluid, decreased fetal movement or vaginal bleeding.      Orders Placed This Encounter  Procedures   Tdap vaccine greater than or equal to 7yo IM   Return in about 2 weeks (around 08/23/2024) for ROB.   Future Appointments  Date Time Provider Department Center  08/30/2024 11:00 AM [facility]    For next visit:  Routine prenatal care    Subjective   Jacqueline Whitehead is feeling generally well. She now has a stool to sit on at work and has not had any more syncopal episodes. Her carpal tunnel symptoms have been worsening. She would like to plan a cesarean birth for her twins.   Movement: Present Contractions: Not present  Objective   Flow sheet Vitals: Pulse Rate: 93 BP: 109/71 Fundal Height: 37 cm Fetal Heart Rate (bpm): 148/136 Total weight gain: 25 lb (11.3 kg)  General Appearance  No acute distress, well appearing, and well nourished Pulmonary   Normal work of breathing Neurologic   Alert and oriented to person, place, and time Psychiatric   Mood and affect within normal  limits  Eleanor Canny, CNM 08/09/24 11:00 AM

## 2024-08-10 ENCOUNTER — Encounter: Payer: Self-pay | Admitting: Registered Nurse

## 2024-08-10 ENCOUNTER — Ambulatory Visit: Payer: Self-pay | Admitting: Registered Nurse

## 2024-08-10 DIAGNOSIS — D509 Iron deficiency anemia, unspecified: Secondary | ICD-10-CM | POA: Insufficient documentation

## 2024-08-10 DIAGNOSIS — D508 Other iron deficiency anemias: Secondary | ICD-10-CM

## 2024-08-10 LAB — 28 WEEK RH+PANEL
Basophils Absolute: 0 x10E3/uL (ref 0.0–0.2)
Basos: 1 %
EOS (ABSOLUTE): 0.1 x10E3/uL (ref 0.0–0.4)
Eos: 2 %
Gestational Diabetes Screen: 139 mg/dL (ref 70–139)
HIV Screen 4th Generation wRfx: NONREACTIVE
Hematocrit: 26 % — ABNORMAL LOW (ref 34.0–46.6)
Hemoglobin: 8.4 g/dL — ABNORMAL LOW (ref 11.1–15.9)
Immature Grans (Abs): 0 x10E3/uL (ref 0.0–0.1)
Immature Granulocytes: 0 %
Lymphocytes Absolute: 1.4 x10E3/uL (ref 0.7–3.1)
Lymphs: 21 %
MCH: 28.2 pg (ref 26.6–33.0)
MCHC: 32.3 g/dL (ref 31.5–35.7)
MCV: 87 fL (ref 79–97)
Monocytes Absolute: 0.4 x10E3/uL (ref 0.1–0.9)
Monocytes: 6 %
Neutrophils Absolute: 4.6 x10E3/uL (ref 1.4–7.0)
Neutrophils: 70 %
Platelets: 164 x10E3/uL (ref 150–450)
RBC: 2.98 x10E6/uL — ABNORMAL LOW (ref 3.77–5.28)
RDW: 12.7 % (ref 11.7–15.4)
RPR Ser Ql: NONREACTIVE
WBC: 6.5 x10E3/uL (ref 3.4–10.8)

## 2024-08-10 LAB — IRON,TIBC AND FERRITIN PANEL
Ferritin: 8 ng/mL — ABNORMAL LOW (ref 15–150)
Iron Saturation: 5 % — CL (ref 15–55)
Iron: 25 ug/dL — ABNORMAL LOW (ref 27–159)
Total Iron Binding Capacity: 469 ug/dL — ABNORMAL HIGH (ref 250–450)
UIBC: 444 ug/dL — ABNORMAL HIGH (ref 131–425)

## 2024-08-10 LAB — VITAMIN B12: Vitamin B-12: 243 pg/mL (ref 232–1245)

## 2024-08-10 MED ORDER — ACCRUFER 30 MG PO CAPS: 30.0000 mg | ORAL_CAPSULE | ORAL | 3 refills | Status: AC

## 2024-08-19 NOTE — Telephone Encounter (Signed)
 Left voicemail to return call.  Per Dr. Leigh, need to advise patient of need for iron infusion and give #[phone] to contact for scheduling.

## 2024-08-24 ENCOUNTER — Ambulatory Visit: Payer: MEDICAID | Admitting: Obstetrics

## 2024-08-24 VITALS — BP 123/86 | HR 99

## 2024-08-24 DIAGNOSIS — Z01818 Encounter for other preprocedural examination: Secondary | ICD-10-CM

## 2024-08-24 DIAGNOSIS — O30043 Twin pregnancy, dichorionic/diamniotic, third trimester: Secondary | ICD-10-CM

## 2024-08-24 DIAGNOSIS — O99320 Drug use complicating pregnancy, unspecified trimester: Secondary | ICD-10-CM

## 2024-08-24 DIAGNOSIS — D509 Iron deficiency anemia, unspecified: Secondary | ICD-10-CM

## 2024-08-24 NOTE — Progress Notes (Signed)
° ° °  Return Prenatal Note   Subjective  23 y.o. G1P0000 at [redacted]w[redacted]d presents for this follow-up prenatal visit. Pregnancy notable for di-di twins, MJ and nicotine  use, hx of self-harm, anxiety/depression/bipolar not on medications, and anemia.   Pt here with partner today. Would like to discuss c-section delivery, not interested in BTL; would like OCPs instead.  Twin pregnancy: didi Last US  at [redacted]w[redacted]d, A: cephalic, 18%ile, B: breech, 72%ile Next growth scan 12/22.   Anemia: has not scheduled iron infusion yet.   Patient reports: Movement: Present Contractions: Not present Denies vaginal bleeding or leaking fluid. Objective  Flow sheet Vitals: Pulse Rate: 99 BP: 123/86 Fetal Heart Rate (bpm): 128/148 Total weight gain: 25 lb (11.3 kg)  General Appearance  No acute distress, well appearing, and well nourished Pulmonary   Normal work of breathing Neurologic   Alert and oriented to person, place, and time Psychiatric   Mood and affect within normal limits   Assessment/Plan   Plan  23 y.o. G1P0000 at [redacted]w[redacted]d presents for follow-up OB visit. Reviewed prenatal record including previous visit note. 1. Dichorionic diamniotic twin pregnancy in third trimester (Primary) -Discussed pt's desire for c-section based on presentation and discordance, B>A -Scheduled for 10/12/24 ([redacted]w[redacted]d) -Plan to keep drape up, FOB does not want to cut cords, and pt desires ON-Q -Next growth US  12/22 w/MFM -Start weekly ANT at 34-36wks  2. Marijuana use during pregnancy -Cessation -UDS on delivery admission  3. Iron deficiency anemia during pregnancy -Hgb last checked at 28wks, = 8.4 -Iron infusions ordered today, follow up and ensure she gets scheduled  Return in about 2 weeks (around 09/07/2024) for ROB.   Future Appointments  Date Time Provider Department Center  08/30/2024 11:00 AM [facility]  1 [facility] [place]  09/07/2024  2:35 PM Sebastian Sham, CNM [facility] None   For next visit:   continue with routine prenatal care   Estil Mangle, DO [facility]

## 2024-08-30 ENCOUNTER — Other Ambulatory Visit: Payer: Self-pay

## 2024-08-30 ENCOUNTER — Telehealth: Payer: Self-pay

## 2024-08-30 ENCOUNTER — Ambulatory Visit: Payer: MEDICAID

## 2024-08-30 ENCOUNTER — Other Ambulatory Visit (HOSPITAL_COMMUNITY): Payer: Self-pay | Admitting: Obstetrics

## 2024-08-30 ENCOUNTER — Other Ambulatory Visit: Payer: MEDICAID

## 2024-08-30 DIAGNOSIS — O30043 Twin pregnancy, dichorionic/diamniotic, third trimester: Secondary | ICD-10-CM

## 2024-08-30 DIAGNOSIS — O30032 Twin pregnancy, monochorionic/diamniotic, second trimester: Secondary | ICD-10-CM

## 2024-08-30 NOTE — Telephone Encounter (Signed)
 Received call from same day surgery stating they did not receive fax sent last week for patient's iron transfusion.  Order re-faxed today to [phone]

## 2024-09-06 ENCOUNTER — Telehealth (HOSPITAL_COMMUNITY): Payer: Self-pay

## 2024-09-06 NOTE — Telephone Encounter (Signed)
 Auth Submission: NO AUTH NEEDED Site of care: Site of care: [facility] Payer: [facility] Health Tailored Plan Medication & CPT/J Code(s) submitted: Feraheme (ferumoxytol) [id number] Diagnosis Code: O99.019/D50.9 Route of submission (phone, fax, portal): phone Phone #[phone] Fax # Auth type: Buy/Bill HB Units/visits requested: 510mg  x 2 doses Reference number: [id number] (Representative: Candace) Approval from: 09/06/24 to 12/05/24

## 2024-09-07 ENCOUNTER — Ambulatory Visit (INDEPENDENT_AMBULATORY_CARE_PROVIDER_SITE_OTHER): Payer: MEDICAID | Admitting: Certified Nurse Midwife

## 2024-09-07 VITALS — BP 125/84 | HR 98 | Wt 204.7 lb

## 2024-09-07 DIAGNOSIS — Z3483 Encounter for supervision of other normal pregnancy, third trimester: Secondary | ICD-10-CM | POA: Diagnosis not present

## 2024-09-07 DIAGNOSIS — Z3A33 33 weeks gestation of pregnancy: Secondary | ICD-10-CM | POA: Diagnosis not present

## 2024-09-07 NOTE — Patient Instructions (Signed)

## 2024-09-07 NOTE — Progress Notes (Signed)
 ROB doing well, feeling good movement. Denies contractions. PTL precautions reviewed. Discussed NST next visit . She is in agreement. She denies any concerns today. She has her iron infusion scheduled for tomorrow. Pt encouraged to keep appointment.   Follow up 2 weeks for ROB and NST.   Zelda Hummer, CNM

## 2024-09-08 ENCOUNTER — Ambulatory Visit
Admission: RE | Admit: 2024-09-08 | Discharge: 2024-09-08 | Disposition: A | Discharge status: Home / Self Care | Payer: MEDICAID | Source: Ambulatory Visit | Attending: Obstetrics | Admitting: Obstetrics

## 2024-09-08 VITALS — BP 119/84 | HR 96 | Temp 97.9°F | Resp 20

## 2024-09-08 DIAGNOSIS — O99019 Anemia complicating pregnancy, unspecified trimester: Secondary | ICD-10-CM | POA: Diagnosis present

## 2024-09-08 DIAGNOSIS — D509 Iron deficiency anemia, unspecified: Secondary | ICD-10-CM | POA: Diagnosis present

## 2024-09-08 MED ORDER — SODIUM CHLORIDE 0.9 % IV SOLN
510.0000 mg | Freq: Once | INTRAVENOUS | Status: AC
  Administered 2024-09-08: 510 mg via INTRAVENOUS
  Filled 2024-09-08: qty 17

## 2024-09-15 ENCOUNTER — Ambulatory Visit
Admission: RE | Admit: 2024-09-15 | Discharge: 2024-09-15 | Disposition: A | Discharge status: Home / Self Care | Payer: MEDICAID | Source: Ambulatory Visit | Attending: Obstetrics | Admitting: Obstetrics

## 2024-09-15 ENCOUNTER — Ambulatory Visit: Payer: MEDICAID

## 2024-09-15 VITALS — BP 116/80 | HR 85 | Temp 98.0°F | Resp 18

## 2024-09-15 DIAGNOSIS — O99019 Anemia complicating pregnancy, unspecified trimester: Secondary | ICD-10-CM | POA: Insufficient documentation

## 2024-09-15 DIAGNOSIS — D509 Iron deficiency anemia, unspecified: Secondary | ICD-10-CM | POA: Diagnosis present

## 2024-09-15 MED ORDER — SODIUM CHLORIDE 0.9 % IV SOLN
510.0000 mg | Freq: Once | INTRAVENOUS | Status: AC
  Administered 2024-09-15: 510 mg via INTRAVENOUS
  Filled 2024-09-15: qty 17

## 2024-09-20 ENCOUNTER — Ambulatory Visit: Payer: MEDICAID

## 2024-09-21 ENCOUNTER — Other Ambulatory Visit: Payer: MEDICAID

## 2024-09-21 ENCOUNTER — Ambulatory Visit: Payer: MEDICAID | Admitting: Advanced Practice Midwife

## 2024-09-21 VITALS — BP 114/81 | HR 98 | Wt 215.2 lb

## 2024-09-21 DIAGNOSIS — Z3403 Encounter for supervision of normal first pregnancy, third trimester: Secondary | ICD-10-CM

## 2024-09-21 DIAGNOSIS — O30043 Twin pregnancy, dichorionic/diamniotic, third trimester: Secondary | ICD-10-CM | POA: Diagnosis not present

## 2024-09-21 DIAGNOSIS — Z2911 Encounter for prophylactic immunotherapy for respiratory syncytial virus (RSV): Secondary | ICD-10-CM

## 2024-09-21 DIAGNOSIS — Z3A35 35 weeks gestation of pregnancy: Secondary | ICD-10-CM | POA: Diagnosis not present

## 2024-09-21 DIAGNOSIS — Z23 Encounter for immunization: Secondary | ICD-10-CM

## 2024-09-21 NOTE — Progress Notes (Signed)
 "   Routine Prenatal Care Visit  Subjective  Jacqueline Whitehead is a 24 y.o. G1P0000 at [redacted]w[redacted]d being seen today for ongoing prenatal care.  She is currently monitored for the following issues for this low-risk pregnancy and has Supervision of normal first pregnancy; Dichorionic diamniotic twin pregnancy in third trimester; Iron deficiency anemia during pregnancy; Carpal tunnel syndrome of right wrist; Attention deficit hyperactivity disorder (ADHD), predominantly inattentive type; MDD (major depressive disorder), recurrent episode, moderate (HCC); and Social anxiety disorder on their problem list.  ----------------------------------------------------------------------------------- Patient reports general discomforts of third trimester and feeling heavy.   Contractions: Not present. Vag. Bleeding: None.  Movement: Present. Leaking Fluid denies.  ----------------------------------------------------------------------------------- The following portions of the patient's history were reviewed and updated as appropriate: allergies, current medications, past family history, past medical history, past social history, past surgical history and problem list. Problem list updated.  Objective  BP 114/81   Pulse 98   Wt 215 lb 3.2 oz (97.6 kg)   LMP 01/20/2024   BMI 35.81 kg/m  Pregravid weight 169 lb (76.7 kg) Total Weight Gain 46 lb 3.2 oz (21 kg) Urinalysis: Urine Protein    Urine Glucose    Fetal Status: Fetal Heart Rate (bpm): 138/142   Movement: Present    A: on maternal left B: on maternal right   General:  Alert, oriented and cooperative. Patient is in no acute distress.  Skin: Skin is warm and dry. No rash noted.   Cardiovascular: Normal heart rate noted  Respiratory: Normal respiratory effort, no problems with respiration noted  Abdomen: Soft, gravid, appropriate for gestational age. Pain/Pressure: Absent (some pelvic cramping)     Pelvic:  Cervical exam deferred        Extremities:  Normal range of motion.  Edema: Trace  Mental Status: Normal mood and affect. Normal behavior. Normal judgment and thought content.   Assessment   24 y.o. G1P0000 at [redacted]w[redacted]d by  10/26/2024, by Last Menstrual Period presenting for routine prenatal visit  Plan   G1 Problems (from 03/15/24 to present)     Problem Noted Diagnosed Resolved   Supervision of normal first pregnancy 03/15/2024 by Tamea Annalee DEL, CMA  No   Overview Addendum 09/21/2024  3:15 PM by Taft Salines, LPN   Clinical Staff Provider  Office Location  [facility] Dating  10/26/2024, by Last Menstrual Period  Language  English Anatomy US   Complete;Posterior placentas  Flu Vaccine  2025 Genetic Screen  NIPS: Neg; Female (Twins Female;female)  TDaP vaccine  08/09/24 Hgb A1C or  GTT Early : Not done Third trimester : 132  Covid declined   LAB RESULTS   Rhogam  AB/Positive/-- (08/11 1041)  Blood Type AB/Positive/-- (08/11 1041)   RSV 09/21/24 Antibody Negative (08/11 1041)  Feeding Plan breast Rubella 1.34 (08/11 1041)  Contraception Nexplanon RPR Non Reactive (08/11 1041)   Circumcision Yes Baby B HBsAg Negative (08/11 1041)   Pediatrician  Carlin Blamer HIV Non Reactive (08/11 1041)  Support Person [facility] Varicella Reactive (08/11 1041)  Prenatal Classes maybe GBS  (For PCN allergy, check sensitivities)     Hep C Non Reactive (08/11 1041)   BTL Consent NA Pap Diagnosis  Date Value Ref Range Status  04/19/2024   Final   - Negative for intraepithelial lesion or malignancy (NILM)    VBAC Consent NA Hgb Electro      CF      SMA  Preterm labor symptoms and general obstetric precautions including but not limited to vaginal bleeding, contractions, leaking of fluid and fetal movement were reviewed in detail with the patient. Please refer to After Visit Summary for other counseling recommendations.   Return in about 1 week (around 09/28/2024) for rob/nst/gbs.  Slater Rains, CNM 09/21/2024  4:05 PM    "

## 2024-09-21 NOTE — Patient Instructions (Addendum)
 RSV (Respiratory Syncytial Virus) Vaccine: What You Need to Know (CDC VIS) Many Vaccine Information Statements are available in Spanish and other languages. See inrails.tn To view this CDC Vaccine Information Statement in English, click the link below or scan the QR code: https://pe.elsevier.com/IJ5TlbjW  This information is not intended to replace advice given to you by your health care provider. Make sure you discuss any questions you have with your health care provider. Vaccine Information Statements (VISs) are third - party content from [facility] & Immunize.org and Elsevier is not responsible for the content.     Immunize.org Market researcher.     Elsevier Patient Education  [employer]. Signs and Symptoms of Labor Labor is the body's natural process of moving the baby and the placenta out of the uterus. The process of labor usually starts when the baby is full-term, between 54 and 41 weeks of pregnancy. Signs and symptoms that you are close to going into labor As your body prepares for labor and the birth of your baby, you may notice the following symptoms in the weeks and days before true labor starts: Passing a small amount of thick, bloody mucus from your vagina. This is called normal bloody show or losing your mucus plug. This may happen more than a week before labor begins, or right before labor begins, as the opening of the cervix starts to widen (dilate). For some women, the entire mucus plug passes at once. For others, pieces of the mucus plug may gradually pass over several days. Your baby moving (dropping) lower in your pelvis to get into position for birth (lightening). When this happens, you may feel more pressure on your bladder and pelvic bone and less pressure on your ribs. This may make it easier to breathe. It may also cause you to need to urinate more often and have problems with bowel movements. Having practice contractions, also called  Braxton Hicks contractions or false labor. These occur at irregular (unevenly spaced) intervals that are more than 10 minutes apart. False labor contractions are common after exercise or sexual activity. They will stop if you change position, rest, or drink fluids. These contractions are usually mild and do not get stronger over time. They may feel like: A backache or back pain. Mild cramps, similar to menstrual cramps. Tightening or pressure in your abdomen. Other early symptoms include: Nausea or loss of appetite. Diarrhea. Having a sudden burst of energy, or feeling very tired. Mood changes. Having trouble sleeping. Signs and symptoms that labor has begun Signs that you are in labor may include: Having contractions that come at regular (evenly spaced) intervals and increase in intensity. This may feel like more intense tightening or pressure in your abdomen that moves to your back. Contractions may also feel like rhythmic pain in your upper thighs or back that comes and goes at regular intervals. If you are delivering for the first time, this change in intensity of contractions often occurs at a more gradual pace. If you have given birth before, you may notice a more rapid progression of contraction changes. Feeling pressure in the vaginal area. Your water breaking (rupture of membranes). This is when the sac of fluid that surrounds your baby breaks. Fluid leaking from your vagina may be clear or blood-tinged. Labor usually starts within 24 hours of your water breaking, but it may take longer to begin. Some people may feel a sudden gush of fluid; others may notice repeatedly damp underwear. Follow these instructions at home:  When  labor starts, or if your water breaks, call your health care provider or nurse care line. Based on your situation, they will determine when you should go in for an exam. During early labor, you may be able to rest and manage symptoms at home. Some strategies to try  at home include: Breathing and relaxation techniques. Taking a warm bath or shower. Listening to music. Using a heating pad on the lower back for pain. If directed, apply heat to the area as often as told by your health care provider. Use the heat source that your health care provider recommends, such as a moist heat pack or a heating pad. Place a towel between your skin and the heat source. Leave the heat on for 20-30 minutes. Remove the heat if your skin turns bright red. This is especially important if you are unable to feel pain, heat, or cold. You have a greater risk of getting burned. Contact a health care provider if: Your labor has started. Your water breaks. You have nausea, vomiting, or diarrhea. Get help right away if: You have painful, regular contractions that are 5 minutes apart or less. Labor starts before you are [redacted] weeks along in your pregnancy. You have a fever. You have bright red blood coming from your vagina. You do not feel your baby moving. You have a severe headache with or without vision problems. You have chest pain or shortness of breath. These symptoms may represent a serious problem that is an emergency. Do not wait to see if the symptoms will go away. Get medical help right away. Call your local emergency services (911 in the [place] Do not drive yourself to the hospital. Summary Labor is your body's natural process of moving your baby and the placenta out of your uterus. The process of labor usually starts when your baby is full-term, between 38 and 40 weeks of pregnancy. When labor starts, or if your water breaks, call your health care provider or nurse care line. Based on your situation, they will determine when you should go in for an exam. This information is not intended to replace advice given to you by your health care provider. Make sure you discuss any questions you have with your health care provider. Document Revised: 01/09/2021 Document Reviewed:  01/09/2021 Elsevier Patient Education  2024 [employer].

## 2024-09-27 ENCOUNTER — Other Ambulatory Visit: Payer: MEDICAID

## 2024-09-28 ENCOUNTER — Other Ambulatory Visit: Payer: MEDICAID

## 2024-09-28 ENCOUNTER — Other Ambulatory Visit (HOSPITAL_COMMUNITY)
Admission: RE | Admit: 2024-09-28 | Discharge: 2024-09-28 | Disposition: A | Discharge status: Home / Self Care | Payer: MEDICAID | Source: Ambulatory Visit | Attending: Obstetrics | Admitting: Obstetrics

## 2024-09-28 ENCOUNTER — Ambulatory Visit (INDEPENDENT_AMBULATORY_CARE_PROVIDER_SITE_OTHER): Payer: MEDICAID | Admitting: Certified Nurse Midwife

## 2024-09-28 VITALS — BP 130/90 | HR 102 | Wt 218.1 lb

## 2024-09-28 DIAGNOSIS — R030 Elevated blood-pressure reading, without diagnosis of hypertension: Secondary | ICD-10-CM | POA: Insufficient documentation

## 2024-09-28 DIAGNOSIS — Z3483 Encounter for supervision of other normal pregnancy, third trimester: Secondary | ICD-10-CM

## 2024-09-28 DIAGNOSIS — Z3A36 36 weeks gestation of pregnancy: Secondary | ICD-10-CM | POA: Diagnosis present

## 2024-09-28 DIAGNOSIS — O163 Unspecified maternal hypertension, third trimester: Secondary | ICD-10-CM | POA: Diagnosis not present

## 2024-09-28 DIAGNOSIS — O30043 Twin pregnancy, dichorionic/diamniotic, third trimester: Secondary | ICD-10-CM

## 2024-09-28 DIAGNOSIS — Z113 Encounter for screening for infections with a predominantly sexual mode of transmission: Secondary | ICD-10-CM | POA: Diagnosis present

## 2024-09-28 DIAGNOSIS — D509 Iron deficiency anemia, unspecified: Secondary | ICD-10-CM

## 2024-09-28 DIAGNOSIS — Z3685 Encounter for antenatal screening for Streptococcus B: Secondary | ICD-10-CM

## 2024-09-28 NOTE — Progress Notes (Deleted)
" ° ° °  Return Prenatal Note   Subjective   24 y.o. G1P0000 at [redacted]w[redacted]d presents for this follow-up prenatal visit.  Patient had NST and anatomy scan done today. She reports fatigue with good fetal movement. Vaginal swabs were done at this visit. She denies any new concerns. Patient reports: Movement: Present Contractions: Not present  Objective   Flow sheet Vitals: Pulse Rate: (!) 102 BP: (!) 130/90 Total weight gain: 49 lb 1.6 oz (22.3 kg)  General Appearance  No acute distress, well appearing, and well nourished Pulmonary   Normal work of breathing Neurologic   Alert and oriented to person, place, and time Psychiatric   Mood and affect within normal limits     BP (!) 130/90   Pulse (!) 102   Wt 218 lb 1.6 oz (98.9 kg)   LMP 01/20/2024   BMI 36.29 kg/m  Estimated Date of Delivery: 10/26/24  [redacted]w[redacted]d  Fetus A Non-Stress Test Interpretation for 09/28/24  Indication: {AOB NST Indications:29190}            Assessment:   1. Encounter for supervision of other normal pregnancy in third trimester   2. Iron deficiency anemia during pregnancy   3. [redacted] weeks gestation of pregnancy   4. Screen for STD (sexually transmitted disease)   5. Screening, antenatal, for Streptococcus B   6. Dichorionic diamniotic twin pregnancy in third trimester   7. Hypertension affecting pregnancy in third trimester      Plan:   Results reviewed by {AOBProviders:28529} and discussed with patient.     Crystal LITTIE Fetters, CMA   Assessment/Plan   Plan  24 y.o. G1P0000 at [redacted]w[redacted]d presents for follow-up OB visit. Reviewed prenatal record including previous visit note.  No problem-specific Assessment & Plan notes found for this encounter.      No orders of the defined types were placed in this encounter.  No follow-ups on file.   Future Appointments  Date Time Provider Department Center  10/06/2024 11:00 AM [facility] US  1 [facility] [place]  10/08/2024 10:00 AM [facility]  [facility] None  10/11/2024 11:00 AM [facility]  1 [facility] [place]    For next visit:  continue with routine prenatal care     Damien Parsley, CNM [facility]  01/20/263:37 PM  "

## 2024-09-28 NOTE — Assessment & Plan Note (Addendum)
 Feeling well overall. Reviewed scheduled C/S.  Reactive NST today. US  scheduled for next week. Reviewed labor warning signs and expectations for birth. Instructed to call office or come to hospital with persistent headache, vision changes, regular contractions, leaking of fluid, decreased fetal movement or vaginal bleeding.

## 2024-09-28 NOTE — Assessment & Plan Note (Signed)
 Blood pressure 130/90 today with retake of 137/95 at the end of the visit. She had a headache yesterday but resolved at this visit. Reports no vision changes or RUQ pain.  -Labs drawn -reactive NST for both babies -Return for blood pressure check on Friday -Precautions reviewed.

## 2024-09-28 NOTE — Progress Notes (Signed)
" ° ° °  Return Prenatal Note   Subjective   24 y.o. G1P0000 at [redacted]w[redacted]d presents for this follow-up prenatal visit.  Patient had NST and anatomy scan done today. She reports fatigue with good fetal movement. Vaginal swabs were done at this visit. She denies any new concerns. Patient reports: Movement: Present Contractions: Not present  Objective   Flow sheet Vitals: Pulse Rate: (!) 102 BP: (!) 130/90 Total weight gain: 49 lb 1.6 oz (22.3 kg)  General Appearance  No acute distress, well appearing, and well nourished Pulmonary   Normal work of breathing Neurologic   Alert and oriented to person, place, and time Psychiatric   Mood and affect within normal limits     BP (!) 130/90   Pulse (!) 102   Wt 218 lb 1.6 oz (98.9 kg)   LMP 01/20/2024   BMI 36.29 kg/m  Estimated Date of Delivery: 10/26/24  [redacted]w[redacted]d  Fetus A Non-Stress Test Interpretation for 09/28/24  Indication: DI/Di Twins  Fetal Heart Rate A Baseline: 140 Mode: External Variability: Moderate Accelerations: 15 x 15 Decelerations: None Multiple birth?: Yes  Uterine Activity Mode: Toco  Interpretation (Fetal Testing) Nonstress Test Interpretation: Reactive Overall Impression: Reassuring for gestational age  Fetal Heart Rate B Baseline:130 Mode: External Variability: Moderate Accelerations: 15 x 15 Decelerations: None Multiple birth?: Yes  Uterine Activity Mode: Toco  Interpretation (Fetal Testing) Nonstress Test Interpretation: Reactive Overall Impression: Reassuring for gestational age    Assessment/Plan   Plan  24 y.o. G1P0000 at [redacted]w[redacted]d presents for follow-up OB visit. Reviewed prenatal record including previous visit note.  Dichorionic diamniotic twin pregnancy in third trimester Feeling well overall. Reviewed scheduled C/S.  Reactive NST today. US  scheduled for next week. Reviewed labor warning signs and expectations for birth. Instructed to call office or come to hospital with persistent  headache, vision changes, regular contractions, leaking of fluid, decreased fetal movement or vaginal bleeding.   Single episode of elevated blood pressure Blood pressure 130/90 today with retake of 137/95 at the end of the visit. She had a headache yesterday but resolved at this visit. Reports no vision changes or RUQ pain.  -Labs drawn -reactive NST for both babies -Return for blood pressure check on Friday -Precautions reviewed.       Orders Placed This Encounter  Procedures   Strep Gp B NAA   Iron, TIBC and Ferritin Panel   CBC   Protein / creatinine ratio, urine   Comprehensive metabolic panel with GFR   Fetal nonstress test   No follow-ups on file.   Future Appointments  Date Time Provider Department Center  10/05/2024  3:55 PM Leigh Sober, MD [facility] None  10/06/2024 11:00 AM [facility] US  1 [facility] [place]  10/08/2024 10:00 AM [facility] None  10/11/2024 11:00 AM [facility] US  1 [facility] [place]    For next visit:  continue with routine prenatal care     Damien Parsley, CNM [facility] 01/20/264:22 PM  "

## 2024-09-29 LAB — CBC
Hematocrit: 31.9 % — ABNORMAL LOW (ref 34.0–46.6)
Hemoglobin: 10.4 g/dL — ABNORMAL LOW (ref 11.1–15.9)
MCH: 28.6 pg (ref 26.6–33.0)
MCHC: 32.6 g/dL (ref 31.5–35.7)
MCV: 88 fL (ref 79–97)
Platelets: 152 x10E3/uL (ref 150–450)
RBC: 3.64 x10E6/uL — ABNORMAL LOW (ref 3.77–5.28)
RDW: 19 % — ABNORMAL HIGH (ref 11.7–15.4)
WBC: 6.3 x10E3/uL (ref 3.4–10.8)

## 2024-09-29 LAB — COMPREHENSIVE METABOLIC PANEL WITH GFR
ALT: 16 IU/L (ref 0–32)
AST: 27 IU/L (ref 0–40)
Albumin: 3.4 g/dL — ABNORMAL LOW (ref 4.0–5.0)
Alkaline Phosphatase: 181 IU/L — ABNORMAL HIGH (ref 41–116)
BUN/Creatinine Ratio: 10 (ref 9–23)
BUN: 6 mg/dL (ref 6–20)
Bilirubin Total: 0.6 mg/dL (ref 0.0–1.2)
CO2: 18 mmol/L — ABNORMAL LOW (ref 20–29)
Calcium: 9.2 mg/dL (ref 8.7–10.2)
Chloride: 106 mmol/L (ref 96–106)
Creatinine, Ser: 0.62 mg/dL (ref 0.57–1.00)
Globulin, Total: 2.1 g/dL (ref 1.5–4.5)
Glucose: 85 mg/dL (ref 70–99)
Potassium: 3.9 mmol/L (ref 3.5–5.2)
Sodium: 139 mmol/L (ref 134–144)
Total Protein: 5.5 g/dL — ABNORMAL LOW (ref 6.0–8.5)
eGFR: 127 mL/min/1.73

## 2024-09-29 LAB — PROTEIN / CREATININE RATIO, URINE
Creatinine, Urine: 149.7 mg/dL
Protein, Ur: 23 mg/dL
Protein/Creat Ratio: 154 mg/g{creat} (ref 0–200)

## 2024-09-30 LAB — CERVICOVAGINAL ANCILLARY ONLY
Bacterial Vaginitis (gardnerella): POSITIVE — AB
Candida Glabrata: NEGATIVE
Candida Vaginitis: NEGATIVE
Chlamydia: NEGATIVE
Comment: NEGATIVE
Comment: NEGATIVE
Comment: NEGATIVE
Comment: NEGATIVE
Comment: NEGATIVE
Comment: NORMAL
Neisseria Gonorrhea: NEGATIVE
Trichomonas: NEGATIVE

## 2024-09-30 LAB — STREP GP B NAA: Strep Gp B NAA: NEGATIVE

## 2024-10-01 ENCOUNTER — Ambulatory Visit: Payer: Self-pay | Admitting: Certified Nurse Midwife

## 2024-10-01 ENCOUNTER — Ambulatory Visit: Payer: MEDICAID

## 2024-10-01 DIAGNOSIS — Z0130 Encounter for examination of blood pressure without abnormal findings: Secondary | ICD-10-CM

## 2024-10-01 NOTE — Patient Instructions (Signed)
 High Blood Pressure During Pregnancy High blood pressure, or hypertension, is when the blood in your body moves with so much force that it could affect your health. It can cause problems for you and your baby. Three types of high blood pressure that can happen during pregnancy. They are: Chronic high blood pressure. This is when you've had high blood pressure for a while, even before getting pregnant. It doesn't go away after your baby is born. Gestational high blood pressure. This type starts after you're [redacted] weeks pregnant and usually goes away once your baby is born. Postpartum high blood pressure. This type is most common within 1 to 2 days after delivery, but it can also happen later -- even up to 12 weeks or more after pregnancy. It can be: High blood pressure that you had before your baby was born and continues after delivery. High blood pressure that starts after you've given birth. If your blood pressure gets really high, it's an emergency. You need to be treated right away. How does high blood pressure affect me? If you had blood pressure problems during pregnancy, you're more likely to get this condition after giving birth. High blood pressure can even happen 12 weeks or more after your baby is born. You may also: Get it when you are pregnant next time. Get it later in life. In some cases, this condition can cause serious problems, such as: Heart problems, such as stroke or heart attack. Injury to kidneys, lungs, or liver. Pre-eclampsia. HELLP syndrome. Seizures. Problems with the placenta. How does high blood pressure affect my baby? Your baby may: Be born early. Have a low birth weight. Have problems during labor. This may mean a C-section could be needed quickly. What are the risks for high blood pressure during pregnancy? You're more likely to get high blood pressure during pregnancy if: You had high blood pressure during a past pregnancy. You're overweight. You're 35 years  or older. You're pregnant for the first time. You're pregnant with more than one baby. You used a fertility method, such as IVF, to get pregnant. You have other problems, such as diabetes, kidney disease, or lupus. What can I do to lower my risk?  Keep a healthy weight. Eat a healthy diet. If you have long-term (chronic) conditions, have them treated before you become pregnant. How is this condition treated? Treatment depends on the type of high blood pressure you have and how serious it is. If you have high blood pressure, your health care provider may give you medicine to treat it and also to lessen risks to you and the baby. If you have very bad high blood pressure, you may need to stay in the hospital for treatment. If your condition gets worse, your baby may need to be born early. If you were taking medicine for your blood pressure before you got pregnant, talk with your provider. You may need to change the medicine during pregnancy if it is not safe for your baby. Follow these instructions at home: Eating and drinking Drink more fluids as told. Avoid caffeine. Lifestyle Do not use alcohol or drugs. Do not smoke, vape, or use nicotine or tobacco. Avoid stress as much as you can. Rest and get plenty of sleep. Get regular exercise. This can help to lower your blood pressure. Ask your health care provider what kinds of exercise are safe for you. General instructions Take your medicines only as told. Keep all follow-up visits. Your provider will check your blood pressure and  make sure that you and your baby are healthy. Contact a health care provider if: Your baby is not moving as much as usual. You feel very tired. You feel faint or dizzy. You throw up, or feel like you may throw up. You have cramping in your belly or have pain in your hips or lower back. You have spotting or bleeding, or you leak fluid from your vagina. Get help right away if: You have chest pain or trouble  breathing. You faint, have a seizure, or cannot think clearly. You have symptoms of serious problems, such as: A headache that doesn't go away when you take medicine. Very bad and sudden swelling of your face, hands, legs, or feet. Vision problems, such as: Seeing spots. Blurry vision. Being sensitive to light. These symptoms may be an emergency. Call 911 right away. Do not wait to see if the symptoms will go away. Do not drive yourself to the hospital. This information is not intended to replace advice given to you by your health care provider. Make sure you discuss any questions you have with your health care provider. Document Revised: 07/08/2023 Document Reviewed: 02/18/2023 Elsevier Patient Education  2024 [employer].

## 2024-10-01 NOTE — Progress Notes (Signed)
" ° ° °  NURSE VISIT NOTE  Subjective:    Patient ID: Jacqueline Whitehead, female    DOB: 2001/04/16, 24 y.o.   MRN: [id number]  HPI  Patient is a 24 y.o. G29P0000 female who presents for BP check per order from [facility], CNM.   Patient reports compliance with prescribed BP medications: no    BP Readings from Last 3 Encounters:  10/01/24 122/88  09/28/24 (!) 130/90  09/21/24 114/81   Pulse Readings from Last 3 Encounters:  10/01/24 94  09/28/24 (!) 102  09/21/24 98    Objective:    BP 122/88   Pulse 94   Resp 16   Ht 5' 5 (1.651 m)   Wt 220 lb 1.6 oz (99.8 kg)   LMP 01/20/2024   BMI 36.63 kg/m   Assessment:   No diagnosis found.   Plan:   Per Dr. Damien Parsley, CNM:  Continue to monitor blood pressure at home. Return to clinic as scheduled.  Patient verbalized understanding of instructions.   Camelia Fetters, CMA [facility]\ "

## 2024-10-05 ENCOUNTER — Encounter: Payer: MEDICAID | Admitting: Obstetrics

## 2024-10-06 ENCOUNTER — Ambulatory Visit: Payer: MEDICAID

## 2024-10-06 ENCOUNTER — Other Ambulatory Visit: Payer: Self-pay

## 2024-10-06 ENCOUNTER — Ambulatory Visit: Payer: MEDICAID | Admitting: Maternal & Fetal Medicine

## 2024-10-06 VITALS — BP 128/77 | HR 93

## 2024-10-06 DIAGNOSIS — O30043 Twin pregnancy, dichorionic/diamniotic, third trimester: Secondary | ICD-10-CM

## 2024-10-06 DIAGNOSIS — Z3403 Encounter for supervision of normal first pregnancy, third trimester: Secondary | ICD-10-CM

## 2024-10-06 NOTE — Progress Notes (Signed)
 After review, MFM consult with provider is not indicated for today  Jacqueline Nathanel Pipe, MD 10/06/2024 5:53 PM  [facility]

## 2024-10-08 ENCOUNTER — Other Ambulatory Visit: Payer: Self-pay

## 2024-10-08 ENCOUNTER — Encounter
Admission: RE | Admit: 2024-10-08 | Discharge: 2024-10-08 | Disposition: A | Payer: MEDICAID | Source: Ambulatory Visit | Attending: Obstetrics

## 2024-10-08 HISTORY — DX: Iron deficiency anemia, unspecified: D50.9

## 2024-10-08 HISTORY — DX: Gastro-esophageal reflux disease without esophagitis: K21.9

## 2024-10-08 HISTORY — DX: Social phobia, unspecified: F40.10

## 2024-10-08 NOTE — Patient Instructions (Addendum)
 Your procedure is scheduled on:10-12-24 Tuesday  Arrival Time: 5:30 AM. Please call Labor and Delivery if you have any questions [phone].  Arrival: If your arrival time is prior to 6:00 am, please enter through the Emergency Room Entrance and you will be directed to Labor and Delivery. If your arrival time is 6:00 am or later, please enter the Medical Mall and follow the greeter's instructions.  REMEMBER: Instructions that are not followed completely may result in serious medical risk, up to and including death; or upon the discretion of your surgeon and anesthesiologist your surgery may need to be rescheduled.  Do not eat food OR drink liquids after midnight the night before surgery.  No gum chewing or hard candies.  One week prior to surgery:Stop NOW (10-08-24) Stop Anti-inflammatories (NSAIDS) such as Advil , Aleve, Ibuprofen , Motrin , Naproxen, Naprosyn and Aspirin  based products such as Excedrin, Goody's Powder, BC Powder  Continue taking all of your other prescription medications up until the day of surgery.  Do NOT take any medication the day of surgery  Continue your Aspirin  up until the day prior to surgery-Do NOT take the day of surgery  No Alcohol for 24 hours before or after surgery.  No Smoking including e-cigarettes for 24 hours prior to surgery.  No chewable tobacco products for at least 6 hours prior to surgery.  No nicotine  patches on the day of surgery.  Do not use any recreational drugs for at least a week prior to your surgery.  Please be advised that the combination of cocaine and anesthesia may have negative outcomes, up to and including death. If you test positive for cocaine, your surgery will be cancelled.  On the morning of surgery brush your teeth with toothpaste and water, you may rinse your mouth with mouthwash if you wish. Do not swallow any toothpaste or mouthwash.  Use CHG soap as directed on instruction sheet.  Do not wear jewelry, make-up,  hairpins, clips or nail polish.  For welded (permanent) jewelry: bracelets, anklets, waist bands, etc.  Please have this removed prior to surgery.  If it is not removed, there is a chance that hospital personnel will need to cut it off on the day of surgery.  Do not wear lotions, powders, or perfumes.   Do not shave body hair from the neck down 48 hours before surgery.  Contact lenses, hearing aids and dentures may not be worn into surgery.  Do not bring valuables to the hospital. [facility] is not responsible for any missing/lost belongings or valuables.   Notify your doctor if there is any change in your medical condition (cold, fever, infection).  Wear comfortable clothing (specific to your surgery type) to the hospital.  After surgery, you can help prevent lung complications by doing breathing exercises.  Take deep breaths and cough every 1-2 hours. Your doctor may order a device called an Incentive Spirometer to help you take deep breaths. When coughing or sneezing, hold a pillow firmly against your incision with both hands. This is called splinting. Doing this helps protect your incision. It also decreases belly discomfort.  Please call the Pre-admissions Testing Dept. at [phone] if you have any questions about these instructions.  Surgery Visitation Policy:  Visitor Passes   All visitors, including children, need an identification sticker when visiting. These stickers must be worn where they can be seen.   Labor & Delivery  Laboring women may have one designated support person and two other visitors of any age visit. The  support person must remain the same. The visitors may switch with other visitors. Visitation is permitted 24 hours per day. The designated support person or a visitor over the age of 16 may sleep overnight in the patient's room. A doula registered with [facility] for labor and delivery support is not considered a visitor. Doulas not registered  with  are considered visitors.  Mother Baby Unit, OB Specialty and Gynecological Care  A designated support person and three visitors of any age may visit. The three visitors may switch out. The designated support person or a visitor age 65 or older may stay overnight in the room. During the postpartum period (up to 6 weeks), if the mother is the patient, she can have her newborn stay with her if there is another support person present who can be responsible for the baby.                                                                                                              Preparing for Surgery with CHLORHEXIDINE  GLUCONATE (CHG) Soap  Chlorhexidine  Gluconate (CHG) Soap  o An antiseptic cleaner that kills germs and bonds with the skin to continue killing germs even after washing  o Used for showering the night before surgery and morning of surgery  Before surgery, you can play an important role by reducing the number of germs on your skin.  CHG (Chlorhexidine  gluconate) soap is an antiseptic cleanser which kills germs and bonds with the skin to continue killing germs even after washing.  Please do not use if you have an allergy to CHG or antibacterial soaps. If your skin becomes reddened/irritated stop using the CHG.  1. Shower the NIGHT BEFORE SURGERY with CHG soap.  2. If you choose to wash your hair, wash your hair first as usual with your normal shampoo.  3. After shampooing, rinse your hair and body thoroughly to remove the shampoo.  4. Use CHG as you would any other liquid soap. You can apply CHG directly to the skin and wash gently with a clean washcloth.  5. Apply the CHG soap to your body only from the neck down. Do not use on open wounds or open sores. Avoid contact with your eyes, ears, mouth, nipple area and genitals (private parts). Wash face and genitals (private parts) and nipple area with your normal soap.  6. Wash thoroughly, paying special  attention to the area where your surgery will be performed.  7. Thoroughly rinse your body with warm water.  8. Do not shower/wash with your normal soap after using and rinsing off the CHG soap.  9. Do not use lotions, oils, etc., after showering with CHG.  10. Pat yourself dry with a clean towel.  11. Wear clean pajamas to bed the night before surgery.  12. Place clean sheets on your bed the night of your shower and do not sleep with pets.  13. Do not apply any deodorants/lotions/powders.  14. Please wear clean clothes to the hospital.  15. Remember to brush  your teeth with your regular toothpaste.

## 2024-10-08 NOTE — Progress Notes (Signed)
 Called pt to complete her anesthesia interview. Pt states that she has a non stress test scheduled at Dr Illona office on 10-11-24 @ 11 am. I informed pt that if she still had her non stress test on 2-2 with the potential for snow, then she could swing by PAT for her labs and urine. I told pt that if her stress test was cancelled, then she did not need to come to PAT for her labs and labs could be done the day of c-section. Pt verbalized understanding

## 2024-10-11 ENCOUNTER — Other Ambulatory Visit: Payer: MEDICAID

## 2024-10-11 ENCOUNTER — Inpatient Hospital Stay: Admission: RE | Admit: 2024-10-11 | Payer: MEDICAID

## 2024-10-11 DIAGNOSIS — Z3403 Encounter for supervision of normal first pregnancy, third trimester: Secondary | ICD-10-CM

## 2024-10-11 NOTE — Progress Notes (Signed)
 Non stress test was cancelled for today in Epic. I had informed pt on Friday not to risk her safety coming to the hospital today for labs/urine if  her stress test was cancelled. Her labs and urine will have to be done in the morning for her c-section. Chart sent up to L&D

## 2024-10-12 ENCOUNTER — Encounter: Payer: Self-pay | Admitting: Obstetrics

## 2024-10-12 ENCOUNTER — Inpatient Hospital Stay: Payer: MEDICAID

## 2024-10-12 ENCOUNTER — Inpatient Hospital Stay
Admission: RE | Admit: 2024-10-12 | Discharge: 2024-10-15 | Disposition: A | Payer: MEDICAID | Source: Ambulatory Visit | Attending: Obstetrics | Admitting: Obstetrics

## 2024-10-12 ENCOUNTER — Other Ambulatory Visit: Payer: Self-pay

## 2024-10-12 ENCOUNTER — Inpatient Hospital Stay: Payer: Self-pay | Admitting: Urgent Care

## 2024-10-12 ENCOUNTER — Encounter: Admission: RE | Disposition: A | Payer: Self-pay | Source: Ambulatory Visit | Attending: Obstetrics

## 2024-10-12 DIAGNOSIS — D509 Iron deficiency anemia, unspecified: Secondary | ICD-10-CM | POA: Diagnosis present

## 2024-10-12 DIAGNOSIS — Z01818 Encounter for other preprocedural examination: Secondary | ICD-10-CM

## 2024-10-12 DIAGNOSIS — O133 Gestational [pregnancy-induced] hypertension without significant proteinuria, third trimester: Secondary | ICD-10-CM | POA: Diagnosis present

## 2024-10-12 DIAGNOSIS — Z3403 Encounter for supervision of normal first pregnancy, third trimester: Principal | ICD-10-CM

## 2024-10-12 DIAGNOSIS — O30043 Twin pregnancy, dichorionic/diamniotic, third trimester: Secondary | ICD-10-CM | POA: Diagnosis present

## 2024-10-12 DIAGNOSIS — O1494 Unspecified pre-eclampsia, complicating childbirth: Secondary | ICD-10-CM | POA: Insufficient documentation

## 2024-10-12 DIAGNOSIS — F1290 Cannabis use, unspecified, uncomplicated: Secondary | ICD-10-CM

## 2024-10-12 LAB — COMPREHENSIVE METABOLIC PANEL WITH GFR
ALT: 16 U/L (ref 0–44)
AST: 34 U/L (ref 15–41)
Albumin: 3.3 g/dL — ABNORMAL LOW (ref 3.5–5.0)
Alkaline Phosphatase: 215 U/L — ABNORMAL HIGH (ref 38–126)
Anion gap: 14 (ref 5–15)
BUN: 7 mg/dL (ref 6–20)
CO2: 18 mmol/L — ABNORMAL LOW (ref 22–32)
Calcium: 9 mg/dL (ref 8.9–10.3)
Chloride: 107 mmol/L (ref 98–111)
Creatinine, Ser: 0.62 mg/dL (ref 0.44–1.00)
GFR, Estimated: >60 mL/min
Glucose, Bld: 70 mg/dL (ref 70–99)
Potassium: 3.7 mmol/L (ref 3.5–5.1)
Sodium: 138 mmol/L (ref 135–145)
Total Bilirubin: 0.8 mg/dL (ref 0.0–1.2)
Total Protein: 6.1 g/dL — ABNORMAL LOW (ref 6.5–8.1)

## 2024-10-12 LAB — CBC
HCT: 32.4 % — ABNORMAL LOW (ref 36.0–46.0)
Hemoglobin: 10.6 g/dL — ABNORMAL LOW (ref 12.0–15.0)
MCH: 28 pg (ref 26.0–34.0)
MCHC: 32.7 g/dL (ref 30.0–36.0)
MCV: 85.5 fL (ref 80.0–100.0)
Platelets: 148 10*3/uL — ABNORMAL LOW (ref 150–400)
RBC: 3.79 MIL/uL — ABNORMAL LOW (ref 3.87–5.11)
RDW: 18.1 % — ABNORMAL HIGH (ref 11.5–15.5)
WBC: 6.5 10*3/uL (ref 4.0–10.5)
nRBC: 0 % (ref 0.0–0.2)

## 2024-10-12 LAB — URINE DRUG SCREEN
Amphetamines: NEGATIVE
Barbiturates: NEGATIVE
Benzodiazepines: NEGATIVE
Cocaine: NEGATIVE
Fentanyl: NEGATIVE
Methadone Scn, Ur: NEGATIVE
Opiates: NEGATIVE
Tetrahydrocannabinol: NEGATIVE

## 2024-10-12 LAB — SYPHILIS: RPR W/REFLEX TO RPR TITER AND TREPONEMAL ANTIBODIES, TRADITIONAL SCREENING AND DIAGNOSIS ALGORITHM: RPR Ser Ql: NONREACTIVE

## 2024-10-12 LAB — ABO/RH: ABO/RH(D): AB POS

## 2024-10-12 LAB — PROTEIN / CREATININE RATIO, URINE
Creatinine, Urine: 276 mg/dL
Protein Creatinine Ratio: 0.7 mg/mg — ABNORMAL HIGH
Total Protein, Urine: 190 mg/dL

## 2024-10-12 LAB — TYPE AND SCREEN
ABO/RH(D): AB POS
Antibody Screen: NEGATIVE

## 2024-10-12 MED ORDER — OXYCODONE HCL 5 MG PO TABS: 5.0000 mg | ORAL_TABLET | Freq: Once | ORAL | Status: DC | PRN

## 2024-10-12 MED ORDER — MISOPROSTOL 200 MCG PO TABS
ORAL_TABLET | ORAL | Status: AC
  Filled 2024-10-12: qty 4

## 2024-10-12 MED ORDER — CHLORHEXIDINE GLUCONATE 0.12 % MT SOLN
OROMUCOSAL | Status: AC
  Administered 2024-10-12: 15 mL
  Filled 2024-10-12: qty 15

## 2024-10-12 MED ORDER — SODIUM CHLORIDE 0.9% FLUSH: 3.0000 mL | INTRAVENOUS | Status: DC | PRN

## 2024-10-12 MED ORDER — SIMETHICONE 80 MG PO CHEW
80.0000 mg | CHEWABLE_TABLET | Freq: Three times a day (TID) | ORAL | Status: DC
  Administered 2024-10-12 – 2024-10-15 (×8): 80 mg via ORAL
  Filled 2024-10-12 (×8): qty 1

## 2024-10-12 MED ORDER — DIPHENHYDRAMINE HCL 25 MG PO CAPS: 25.0000 mg | ORAL_CAPSULE | Freq: Four times a day (QID) | ORAL | Status: DC | PRN

## 2024-10-12 MED ORDER — ONDANSETRON HCL 4 MG/2ML IJ SOLN
INTRAMUSCULAR | Status: AC
  Filled 2024-10-12: qty 2

## 2024-10-12 MED ORDER — LACTATED RINGERS IV SOLN: INTRAVENOUS | Status: DC

## 2024-10-12 MED ORDER — COCONUT OIL OIL: 1.0000 | TOPICAL_OIL | Status: DC | PRN

## 2024-10-12 MED ORDER — SOD CITRATE-CITRIC ACID 500-334 MG/5ML PO SOLN
30.0000 mL | ORAL | Status: AC
  Administered 2024-10-12: 30 mL via ORAL
  Filled 2024-10-12: qty 30

## 2024-10-12 MED ORDER — KETOROLAC TROMETHAMINE 30 MG/ML IJ SOLN
30.0000 mg | Freq: Four times a day (QID) | INTRAMUSCULAR | Status: AC
  Administered 2024-10-12 – 2024-10-13 (×4): 30 mg via INTRAVENOUS
  Filled 2024-10-12 (×4): qty 1

## 2024-10-12 MED ORDER — TRANEXAMIC ACID-NACL 1000-0.7 MG/100ML-% IV SOLN
INTRAVENOUS | Status: AC
  Filled 2024-10-12: qty 100

## 2024-10-12 MED ORDER — SIMETHICONE 80 MG PO CHEW: 80.0000 mg | CHEWABLE_TABLET | ORAL | Status: DC | PRN

## 2024-10-12 MED ORDER — DROPERIDOL 2.5 MG/ML IJ SOLN: 0.6250 mg | Freq: Once | INTRAMUSCULAR | Status: DC | PRN

## 2024-10-12 MED ORDER — BUPIVACAINE HCL (PF) 0.5 % IJ SOLN: 5.0000 mL | Freq: Once | INTRAMUSCULAR | Status: DC

## 2024-10-12 MED ORDER — BUPIVACAINE 0.25 % ON-Q PUMP DUAL CATH 400 ML
400.0000 mL | INJECTION | Status: DC
  Filled 2024-10-12: qty 400

## 2024-10-12 MED ORDER — OXYCODONE HCL 5 MG PO TABS
5.0000 mg | ORAL_TABLET | ORAL | Status: DC | PRN
  Administered 2024-10-13 (×2): 10 mg via ORAL
  Filled 2024-10-12 (×2): qty 2

## 2024-10-12 MED ORDER — MORPHINE SULFATE (PF) 0.5 MG/ML IJ SOLN
INTRAMUSCULAR | Status: AC
  Filled 2024-10-12: qty 10

## 2024-10-12 MED ORDER — WITCH HAZEL-GLYCERIN EX PADS: 1.0000 | MEDICATED_PAD | CUTANEOUS | Status: DC | PRN

## 2024-10-12 MED ORDER — ACETAMINOPHEN 500 MG PO TABS: 1000.0000 mg | ORAL_TABLET | Freq: Four times a day (QID) | ORAL | Status: DC

## 2024-10-12 MED ORDER — BUPIVACAINE IN DEXTROSE 0.75-8.25 % IT SOLN
INTRATHECAL | Status: DC | PRN
  Administered 2024-10-12: 1.6 mL via INTRATHECAL

## 2024-10-12 MED ORDER — IBUPROFEN 800 MG PO TABS
800.0000 mg | ORAL_TABLET | Freq: Three times a day (TID) | ORAL | Status: DC
  Administered 2024-10-13 – 2024-10-15 (×6): 800 mg via ORAL
  Filled 2024-10-12 (×7): qty 1

## 2024-10-12 MED ORDER — SODIUM CHLORIDE 0.9 % IV SOLN
2.0000 g | INTRAVENOUS | Status: AC
  Administered 2024-10-12: 2 g via INTRAVENOUS
  Filled 2024-10-12: qty 2

## 2024-10-12 MED ORDER — FENTANYL CITRATE (PF) 100 MCG/2ML IJ SOLN: 25.0000 ug | INTRAMUSCULAR | Status: DC | PRN

## 2024-10-12 MED ORDER — DIPHENHYDRAMINE HCL 25 MG PO CAPS
25.0000 mg | ORAL_CAPSULE | ORAL | Status: DC | PRN
  Filled 2024-10-12: qty 1

## 2024-10-12 MED ORDER — LACTATED RINGERS IV SOLN: Freq: Once | INTRAVENOUS | Status: AC

## 2024-10-12 MED ORDER — DIBUCAINE (PERIANAL) 1 % EX OINT: 1.0000 | TOPICAL_OINTMENT | CUTANEOUS | Status: DC | PRN

## 2024-10-12 MED ORDER — NALOXONE HCL 0.4 MG/ML IJ SOLN: 0.4000 mg | INTRAMUSCULAR | Status: DC | PRN

## 2024-10-12 MED ORDER — ACETAMINOPHEN 500 MG PO TABS
1000.0000 mg | ORAL_TABLET | ORAL | Status: AC
  Administered 2024-10-12: 1000 mg via ORAL
  Filled 2024-10-12: qty 2

## 2024-10-12 MED ORDER — BUPIVACAINE ON-Q PAIN PUMP (FOR ORDER SET NO CHG)
INJECTION | Status: AC
  Filled 2024-10-12: qty 1

## 2024-10-12 MED ORDER — KETOROLAC TROMETHAMINE 30 MG/ML IJ SOLN: 30.0000 mg | Freq: Four times a day (QID) | INTRAMUSCULAR | Status: AC | PRN

## 2024-10-12 MED ORDER — OXYTOCIN-SODIUM CHLORIDE 30-0.9 UT/500ML-% IV SOLN
INTRAVENOUS | Status: AC
  Filled 2024-10-12: qty 500

## 2024-10-12 MED ORDER — BUPIVACAINE HCL 0.5 % IJ SOLN
INTRAMUSCULAR | Status: DC | PRN
  Administered 2024-10-12: 10 mL

## 2024-10-12 MED ORDER — NALOXONE HCL 4 MG/10ML IJ SOLN
1.0000 ug/kg/h | INTRAVENOUS | Status: DC | PRN
  Filled 2024-10-12: qty 5

## 2024-10-12 MED ORDER — BUPIVACAINE HCL (PF) 0.25 % IJ SOLN
INTRAMUSCULAR | Status: AC
  Filled 2024-10-12: qty 10

## 2024-10-12 MED ORDER — PRENATAL MULTIVITAMIN CH
1.0000 | ORAL_TABLET | Freq: Every day | ORAL | Status: DC
  Administered 2024-10-13 – 2024-10-15 (×3): 1 via ORAL
  Filled 2024-10-12 (×4): qty 1

## 2024-10-12 MED ORDER — FENTANYL CITRATE (PF) 100 MCG/2ML IJ SOLN
INTRAMUSCULAR | Status: AC
  Filled 2024-10-12: qty 2

## 2024-10-12 MED ORDER — ONDANSETRON HCL 4 MG/2ML IJ SOLN
4.0000 mg | Freq: Three times a day (TID) | INTRAMUSCULAR | Status: DC | PRN
  Administered 2024-10-12: 4 mg via INTRAVENOUS
  Filled 2024-10-12: qty 2

## 2024-10-12 MED ORDER — LACTATED RINGERS IV BOLUS
1000.0000 mL | Freq: Once | INTRAVENOUS | Status: AC
  Administered 2024-10-12: 1000 mL via INTRAVENOUS

## 2024-10-12 MED ORDER — DIPHENHYDRAMINE HCL 50 MG/ML IJ SOLN: 12.5000 mg | INTRAMUSCULAR | Status: DC | PRN

## 2024-10-12 MED ORDER — PHENYLEPHRINE HCL-NACL 20-0.9 MG/250ML-% IV SOLN
INTRAVENOUS | Status: AC
  Filled 2024-10-12: qty 250

## 2024-10-12 MED ORDER — FENTANYL CITRATE (PF) 100 MCG/2ML IJ SOLN
INTRAMUSCULAR | Status: DC | PRN
  Administered 2024-10-12: 15 ug via INTRATHECAL

## 2024-10-12 MED ORDER — ACETAMINOPHEN 500 MG PO TABS
1000.0000 mg | ORAL_TABLET | Freq: Four times a day (QID) | ORAL | Status: DC
  Administered 2024-10-12 – 2024-10-14 (×6): 1000 mg via ORAL
  Filled 2024-10-12 (×7): qty 2

## 2024-10-12 MED ORDER — KETOROLAC TROMETHAMINE 30 MG/ML IJ SOLN
INTRAMUSCULAR | Status: AC
  Filled 2024-10-12: qty 1

## 2024-10-12 MED ORDER — MEPERIDINE HCL 25 MG/ML IJ SOLN: 6.2500 mg | INTRAMUSCULAR | Status: DC | PRN

## 2024-10-12 MED ORDER — POVIDONE-IODINE 10 % EX SWAB
2.0000 | Freq: Once | CUTANEOUS | Status: AC
  Administered 2024-10-12: 2 via TOPICAL

## 2024-10-12 MED ORDER — LACTATED RINGERS IV BOLUS
500.0000 mL | Freq: Once | INTRAVENOUS | Status: AC
  Administered 2024-10-12: 500 mL via INTRAVENOUS

## 2024-10-12 MED ORDER — LIDOCAINE HCL (PF) 1 % IJ SOLN
INTRAMUSCULAR | Status: DC | PRN
  Administered 2024-10-12: 3 mL

## 2024-10-12 MED ORDER — BUPIVACAINE ON-Q PAIN PUMP (FOR ORDER SET NO CHG)
INJECTION | Status: DC
  Filled 2024-10-12 (×2): qty 1

## 2024-10-12 MED ORDER — OXYTOCIN-SODIUM CHLORIDE 30-0.9 UT/500ML-% IV SOLN
2.5000 [IU]/h | INTRAVENOUS | Status: AC
  Administered 2024-10-12: 2.5 [IU]/h via INTRAVENOUS

## 2024-10-12 MED ORDER — PHENYLEPHRINE HCL-NACL 20-0.9 MG/250ML-% IV SOLN
INTRAVENOUS | Status: DC | PRN
  Administered 2024-10-12: 50 ug/min via INTRAVENOUS

## 2024-10-12 MED ORDER — KETOROLAC TROMETHAMINE 30 MG/ML IJ SOLN
INTRAMUSCULAR | Status: DC | PRN
  Administered 2024-10-12: 30 mg via INTRAVENOUS

## 2024-10-12 MED ORDER — 0.9 % SODIUM CHLORIDE (POUR BTL) OPTIME
TOPICAL | Status: DC | PRN
  Administered 2024-10-12: 1000 mL

## 2024-10-12 MED ORDER — SENNOSIDES-DOCUSATE SODIUM 8.6-50 MG PO TABS
2.0000 | ORAL_TABLET | Freq: Every day | ORAL | Status: DC
  Administered 2024-10-13 – 2024-10-15 (×3): 2 via ORAL
  Filled 2024-10-12 (×3): qty 2

## 2024-10-12 MED ORDER — OXYTOCIN-SODIUM CHLORIDE 30-0.9 UT/500ML-% IV SOLN
INTRAVENOUS | Status: DC | PRN
  Administered 2024-10-12: 125 mL/h via INTRAVENOUS
  Administered 2024-10-12: 150 mL via INTRAVENOUS

## 2024-10-12 MED ORDER — OXYCODONE HCL 5 MG/5ML PO SOLN: 5.0000 mg | Freq: Once | ORAL | Status: DC | PRN

## 2024-10-12 MED ORDER — MORPHINE SULFATE (PF) 0.5 MG/ML IJ SOLN
INTRAMUSCULAR | Status: DC | PRN
  Administered 2024-10-12: 100 ug via EPIDURAL

## 2024-10-12 MED ORDER — MENTHOL 3 MG MT LOZG: 1.0000 | LOZENGE | OROMUCOSAL | Status: DC | PRN

## 2024-10-12 MED ORDER — ACETAMINOPHEN 10 MG/ML IV SOLN: 1000.0000 mg | Freq: Once | INTRAVENOUS | Status: DC | PRN

## 2024-10-12 MED ORDER — ONDANSETRON HCL 4 MG/2ML IJ SOLN
INTRAMUSCULAR | Status: DC | PRN
  Administered 2024-10-12: 4 mg via INTRAVENOUS

## 2024-10-12 NOTE — Anesthesia Procedure Notes (Signed)
 Spinal  Patient location during procedure: OR Start time: 10/12/2024 8:02 AM Reason for block: surgical anesthesia  Staffing Performed: resident/CRNA  Authorized by: Myra Lynwood MATSU, MD   Performed by: Governor Selinda NOVAK, CRNA  Preanesthetic Checklist Completed: patient identified, IV checked, site marked, risks and benefits discussed, surgical consent, monitors and equipment checked, pre-op evaluation and timeout performed Spinal Block Patient position: sitting Prep: ChloraPrep Patient monitoring: heart rate, cardiac monitor, continuous pulse ox and blood pressure Approach: midline Location: L3-4 Injection technique: single-shot Needle Needle type: Pencan  Needle gauge: 24 G Needle length: 9 cm Assessment Sensory level: T4 Events: CSF return  Additional Notes Braun Spinal Tray Lot: [id number] Expire: 2026-09-30

## 2024-10-13 ENCOUNTER — Encounter: Payer: Self-pay | Admitting: Certified Nurse Midwife

## 2024-10-13 ENCOUNTER — Encounter: Payer: Self-pay | Admitting: Obstetrics

## 2024-10-13 DIAGNOSIS — O1494 Unspecified pre-eclampsia, complicating childbirth: Secondary | ICD-10-CM | POA: Insufficient documentation

## 2024-10-13 LAB — CBC
HCT: 26.8 % — ABNORMAL LOW (ref 36.0–46.0)
Hemoglobin: 8.5 g/dL — ABNORMAL LOW (ref 12.0–15.0)
MCH: 28.2 pg (ref 26.0–34.0)
MCHC: 31.7 g/dL (ref 30.0–36.0)
MCV: 89 fL (ref 80.0–100.0)
Platelets: 101 10*3/uL — ABNORMAL LOW (ref 150–400)
RBC: 3.01 MIL/uL — ABNORMAL LOW (ref 3.87–5.11)
RDW: 17.8 % — ABNORMAL HIGH (ref 11.5–15.5)
WBC: 5.7 10*3/uL (ref 4.0–10.5)
nRBC: 0 % (ref 0.0–0.2)

## 2024-10-13 MED ORDER — METHYLPREDNISOLONE SODIUM SUCC 125 MG IJ SOLR: 125.0000 mg | Freq: Once | INTRAMUSCULAR | Status: DC | PRN

## 2024-10-13 MED ORDER — SODIUM CHLORIDE 0.9 % IV BOLUS: 500.0000 mL | Freq: Once | INTRAVENOUS | Status: DC | PRN

## 2024-10-13 MED ORDER — EPINEPHRINE 0.3 MG/0.3ML IJ SOAJ: 0.3000 mg | Freq: Once | INTRAMUSCULAR | Status: DC | PRN

## 2024-10-13 MED ORDER — SODIUM CHLORIDE 0.9 % IV SOLN: INTRAVENOUS | Status: AC | PRN

## 2024-10-13 MED ORDER — DIPHENHYDRAMINE HCL 50 MG/ML IJ SOLN: 25.0000 mg | Freq: Once | INTRAMUSCULAR | Status: DC | PRN

## 2024-10-13 MED ORDER — LACTATED RINGERS IV BOLUS
500.0000 mL | Freq: Once | INTRAVENOUS | Status: AC
  Administered 2024-10-13: 500 mL via INTRAVENOUS

## 2024-10-13 MED ORDER — LACTATED RINGERS IV SOLN: INTRAVENOUS | Status: DC

## 2024-10-13 MED ORDER — ALBUTEROL SULFATE (2.5 MG/3ML) 0.083% IN NEBU: 2.5000 mg | INHALATION_SOLUTION | Freq: Once | RESPIRATORY_TRACT | Status: DC | PRN

## 2024-10-13 MED ORDER — IRON SUCROSE 500 MG IVPB - SIMPLE MED
500.0000 mg | Freq: Once | INTRAVENOUS | Status: AC
  Administered 2024-10-13: 500 mg via INTRAVENOUS
  Filled 2024-10-13: qty 500

## 2024-10-13 NOTE — Discharge Instructions (Signed)
 Here are therapy and mental health counseling resources in [place], [place], including local practices with contact info and what they offer: ?? [facility] Individual & Family Therapy [facility] Supportive counseling for individuals, teens, families; sliding scale available. ?? [address] [place], [place] [address] ?? [phone] [facility] General mental health services and counseling support. ?? [address] [place], [place] [address] ?? [phone] [facility] Therapy and wellness counseling for various mental health needs. ?? [address] [place], [place] [address] ?? [phone] [facility] Personalized counseling and mental health support. ?? [address], [place], [place] [address] ?? [phone] [facility] Holistic counseling services in a supportive setting. ?? [address], [place], [place] [address] ?? [phone] [facility] Experienced Licensed Clinical Mental Health Counselor offering tailored therapy. ?? [address], [place], [place] [address] ?? [phone] [facility] Mental health and wellness counseling for individuals and families. ?? [address], [place], [place] [address] ?? [phone] [facility] Warm, personalized counseling services for various concerns. ?? [address], [place], [place] [address] ?? [phone]  ?? Public/Community Behavioral Health Support [facility] Offers counseling for stress, depression, anxiety, family and life challenges through the county health department. Services are available by appointment.  ?? [facility] Department ?? Main: [phone]) [phone] (appointments: [phone])  Crisis & Mobile Support Services If you or someone you care about is in crisis or needs  immediate support, call [facility] at [phone] -- available 24/7 to connect you with crisis providers and mobile teams.  For walk-in crisis assessment, services are also available through [facility] in [place]: ?? [address], [place], [place]  ?? [phone]

## 2024-10-13 NOTE — Lactation Note (Signed)
 This note was copied from a baby's chart. Lactation Consultation Note  Patient Name: Jacqueline Whitehead Unijb'd Date: 10/13/2024 Age:25 hours Reason for consult: Follow-up assessment;Mother's request;Primapara;Early term 37-38.6wks;Breastfeeding assistance;RN request   Maternal Data First time mom, C/S of Di-Di twin gestation. Mom with history of anemia, gestational hypertension, anxiety, bipolar 1, major depressive disorder, social anxiety, MJ and nicotine  use.   Mom requested LC assist her with breastfeeding baby and use of Symphony electric breastpump. Mom reports she would like to do some breastfeeding. Has patient been taught Hand Expression?: Yes Does the patient have breastfeeding experience prior to this delivery?: No  Feeding Mother's Current Feeding Choice: Breast Milk and Formula Nipple Type: Slow - flow Provided tips and strategies to maximize position and latch techniques. Baby latched well, audible swallows were noted. Mom and LC noted audible swallows. After 6 minutes baby would not continue after burping. FOB provided formula supplemental bottle post breastfeeding.  LATCH Score Latch: Grasps breast easily, tongue down, lips flanged, rhythmical sucking.  Audible Swallowing: Spontaneous and intermittent  Type of Nipple: Everted at rest and after stimulation  Comfort (Breast/Nipple): Soft / non-tender  Hold (Positioning): Assistance needed to correctly position infant at breast and maintain latch.  LATCH Score: 9   Lactation Tools Discussed/Used Tools: Pump Breast pump type: Double-Electric Breast Pump (Mom was using her Medela wearable pumps but was agreeable to use Syphony hospital grade pump.) Pump Education: Setup, frequency, and cleaning;Milk Storage Reason for Pumping: Mom of early term multiples who is undecided if she will exclusively pump or breastfeed, pump, and formula feed. Pumping frequency: goal of 8 times/24 hours if baby is not breastfeeding to  maximize milk production Pumped volume: 2 mL  Interventions Interventions: Breast feeding basics reviewed;Assisted with latch;Breast massage;Hand express;Breast compression;Adjust position;Support pillows;Position options;Education  Discharge Pump: Personal;Hands Free  Consult Status Consult Status: Follow-up Date: 10/14/24 Follow-up type: In-patient  Update provided to care nurse.  [name] 10/13/2024, 9:52 PM

## 2024-10-13 NOTE — Lactation Note (Signed)
 This note was copied from a baby's chart. Lactation Consultation Note  Patient Name: Jacqueline Whitehead Unijb'd Date: 10/13/2024 Age:25 hours Reason for consult: Follow-up assessment;Primapara;Early term 37-38.6wks;Mother's request;Breastfeeding assistance;RN request   Maternal Data First time mom, C/S of Di-Di twin gestation. Mom with history of anemia, gestational hypertension, anxiety, bipolar 1, major depressive disorder, social anxiety, MJ and nicotine  use.   Mom requested LC assist her with breastfeeding baby and use of Symphony electric breastpump. Mom reports she would like to do some breastfeeding in addition to pumping and formula feeding.  Has patient been taught Hand Expression?: Yes Does the patient have breastfeeding experience prior to this delivery?: No  Feeding Mother's Current Feeding Choice: Breast Milk and Formula Baby positioned at mom's left breast. Baby would not awaken to latch despite strategies to wake a sleepy baby. Mom was able to offer formula supplement via bottle after a few attempts. After mom pumped baby was awake post bottle feed. FOB provided the 3 ml's of pumped colostrum via slow flow bottle nipple. LATCH Score Latch: Too sleepy or reluctant, no latch achieved, no sucking elicited.  Audible Swallowing: None  Type of Nipple: Everted at rest and after stimulation  Comfort (Breast/Nipple): Soft / non-tender  Hold (Positioning): Assistance needed to correctly position infant at breast and maintain latch.  LATCH Score: 5   Lactation Tools Discussed/Used Tools: Pump Breast pump type: Double-Electric Breast Pump (Mom agreeable to try DEBP Symphony while here in the hospital.) Pump Education: Setup, frequency, and cleaning;Milk Storage (Reviewed. Per mom she is using 24 mm flange with her pump from home. Advised mom to use 21 mm on her left breast.) Reason for Pumping: Per mom she plans on pumping and now would like to do some breastfeeding  . Pumping frequency: goal of 8 pump sessions to maximize milk production . Pumped volume: 2 mL  Interventions Interventions: Breast feeding basics reviewed;Assisted with latch;Breast massage;Hand express;Breast compression;Adjust position;Support pillows;Education;DEBP  Discharge Pump: Hands Free;Personal (Medela Wearable breastpump.)  Consult Status Consult Status: Follow-up Date: 10/14/24 Follow-up type: In-patient  Update provided to care nurse.  [name] 10/13/2024, 10:09 PM

## 2024-10-13 NOTE — Progress Notes (Addendum)
 Obstetric Postpartum/PostOperative Daily Progress Note Subjective:  24 y.o. G1P1002 post-operative day # 1 status post primary cesarean delivery for di/di twins.  She is ambulating, is tolerating po, is not voiding spontaneously-foley remains in place due to low urine output overnight.  Her pain is well controlled on PO pain medications. Her lochia is less than menses. Denies headache, visual changes or RUQ pain. Would like help from lactation today.   Medications SCHEDULED MEDICATIONS   acetaminophen   1,000 mg Oral Q6H   ibuprofen   800 mg Oral TID   prenatal multivitamin  1 tablet Oral Q1200   senna-docusate  2 tablet Oral Daily   simethicone   80 mg Oral TID PC    MEDICATION INFUSIONS   bupivacaine  ON-Q pain pump     lactated ringers  125 mL/hr at 10/13/24 0800   naloxone  HCl (NARCAN ) 2 mg in dextrose  5 % 250 mL infusion      PRN MEDICATIONS  coconut oil, witch hazel-glycerin  **AND** dibucaine, diphenhydrAMINE  **OR** diphenhydrAMINE , diphenhydrAMINE , menthol , naloxone  **AND** sodium chloride  flush, naloxone  HCl (NARCAN ) 2 mg in dextrose  5 % 250 mL infusion, ondansetron  (ZOFRAN ) IV, oxyCODONE , simethicone     Objective:   Vitals:   10/13/24 0305 10/13/24 0400 10/13/24 0822 10/13/24 0854  BP: 119/76  (!) 134/100 (!) 131/95  Pulse: 67 71 69   Resp: 18  18   Temp: (!) 97.5 F (36.4 C)  97.9 F (36.6 C)   TempSrc: Oral  Oral   SpO2: 95% 94% 99%   Weight:      Height:        Current Vital Signs 24h Vital Sign Ranges  T 97.9 F (36.6 C) Temp  Avg: 98.4 F (36.9 C)  Min: 97.5 F (36.4 C)  Max: 99.3 F (37.4 C)  BP (!) 131/95 (Notify Kendra, RN) BP  Min: 111/81  Max: 134/100  HR 69 Pulse  Avg: 76.2  Min: 67  Max: 83  RR 18 Resp  Avg: 18.6  Min: 18  Max: 20  SaO2 99 % Room Air SpO2  Avg: 95.9 %  Min: 94 %  Max: 99 %       24 Hour I/O Current Shift I/O  Time Ins Outs 02/03 0701 - 02/04 0700 In: 742.9 [I.V.:622] Out: 1235 [Urine:820] 02/04 0701 - 02/04 1900 In: -  Out: 100  [Urine:100]  General: NAD Pulmonary: no increased work of breathing Abdomen: non-distended, non-tender, fundus firm at level of umbilicus Inc: Clean/dry/intact Extremities: no edema, no erythema, no tenderness  Labs:  Recent Labs  Lab 10/12/24 0510 10/13/24 0557  WBC 6.5 5.7  HGB 10.6* 8.5*  HCT 32.4* 26.8*  PLT 148* 101*     Assessment:   24 y.o. G1P1002 postoperative day # 1 status post primary cesarean section Pre-eclampsia without severe features Acute blood loss anemia, significant for admission, VSS, endorses fatigue (received IV iron  infusion in pregnancy) last infusion 1/7 Plan:  1) Pre-eclampsia without severe features: continue to monitor BP, magnesium not indicated given absence of severe features.  2)  Acute blood loss anemia - hemodynamically stable, IV iron  infusion ordered  2) AB POS Performed at [facility], [address] [place], [place] [address]  / Dama 1.34 (08/11 1041)/ Varicella Immune  3) TDAP: received prenatally 08/09/24; Flu vaccine received prenatally  4) breast and bottle /Contraception = considering partner vasectomy  5) Disposition: anticipate discharge home POD#2 or #3  Harlene LITTIE Cisco, CNM

## 2024-10-13 NOTE — Lactation Note (Signed)
 This note was copied from a baby's chart. Lactation Consultation Note  Patient Name: Jacqueline Whitehead Date: 10/13/2024 Age:24 years Reason for consult: Follow-up assessment;Primapara;Early term 37-38.6wks;RN request   Maternal Data First time mom, C/S of Di-Di twin gestation. Mom with history of anemia, gestational hypertension, anxiety, bipolar 1, major depressive disorder, social anxiety, MJ and nicotine  use.  On follow-up today mom reports she is pumping with her Medela breastpump ( hands free version). She is getting small amounts of colostrum. Mom reports she is still undecided if her feeding plan is to exclusively pump and provide milk or if she will do some breastfeeding and also pump. Per mom she did offer her little girl to breastfeed and she latched for a few minutes and did not continue. Recommended if mom plans on breastfeeding and she would like breastfeeding assistance to call LC(number on board) or care nurse as needed. Has patient been taught Hand Expression?: Yes Does the patient have breastfeeding experience prior to this delivery?: No  Feeding Mother's Current Feeding Choice: Breast Milk and Formula Nipple Type: Slow - flow   Lactation Tools Discussed/Used Tools: Pump Breast pump type: Other (comment);Double-Electric Breast Pump (Mom is using her Medela wearable pumps. LC discussed with mom for nopw using the Symphony DEBP.) Pump Education: Setup, frequency, and cleaning;Milk Storage Reason for Pumping: Mother of multiples is unsure if she will exclusively pump and bottle feed or if she wants to do some breastfeeding. Per mom she offered her little girl to latch and she dis so for a few minutes. Pumping frequency: goal of 8 pump sessions in 24 hours Pumped volume: 2 mL  Interventions Interventions: Breast feeding basics reviewed;Hand express;DEBP;Education  Discharge Pump: Hands Free;Personal;DEBP (Medela wearable breastpump)  Consult Status Consult  Status: Follow-up Date: 10/14/24 Follow-up type: In-patient  Update provided to care nurse.  [name] 10/13/2024, 6:25 PM

## 2024-10-13 NOTE — Clinical Social Work Maternal (Signed)
 " CLINICAL SOCIAL WORK MATERNAL/CHILD NOTE  Patient Details  Name: Jacqueline Whitehead MRN: [id number] Date of Birth: 24-Apr-2001  Date:  10/13/2024  Clinical Social Worker Initiating Note:  Corrie Whitehead Date/Time: Initiated:  10/13/24/1115     Child's Name:  Jacqueline Whitehead   Biological Parents:  Mother, Father   Need for Interpreter:  None   Reason for Referral:  Current Substance Use/Substance Use During Pregnancy     Address:  [address] [place] [address]    Phone number:  [phone] (home)     Additional phone number:   Household Members/Support Persons (HM/SP):   Household Member/Support Person 1   HM/SP Name Relationship DOB or Age  HM/SP -1 Jacqueline Whitehead FOB 24  HM/SP -2        HM/SP -3        HM/SP -4        HM/SP -5        HM/SP -6        HM/SP -7        HM/SP -8          Natural Supports (not living in the home):  Immediate Family, Investment Banker, Corporate Supports:     Employment: Full-time   Type of Work: Counsellor works   Education:  9 to 11 years   Homebound arranged: No  Surveyor, Quantity Resources:  [employer]   Other Resources:  [employer], Sales Executive     Cultural/Religious Considerations Which May Impact Care:    Strengths:  Ability to meet basic needs  , Optometrist chosen, Home prepared for child  , Compliance with medical plan     Psychotropic Medications:         Pediatrician:    [employer]  Pediatrician List:   [facility]    [place]  (Carlin Blamer)  [facility]      Pediatrician Fax Number:    Risk Factors/Current Problems:  Substance Use     Cognitive State:  Alert     Mood/Affect:  Calm     CSW Assessment:  Chart reviewed. I received a consult for substance use during pregnancy. The patient tested positive for THC and cotinine 04/2024. I was able to speak with the patient and the patient significant other/FOB at the bedside today. I  introduced myself, my role, reason for consult, and HIPAA. The patient consented to continuing consult while FOB was in the room.   The patient confirmed that her address is [address] [place] [address] and telephone number [phone]. The patient reports that she lives in the home with her significant other. The patient reports that she lives on the same land as the FOB parents. The patient reports that they are supportive. The patient reports that the FOB is [employer]. The patient reports that she receives [facility] and food stamps.   The patient reports that her highest level of education is 12th grade. 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 The patient reports that she will breast feed and has a breat pump. The patient reports that she has a PCP and that baby pediatrician is carlin drew.   The patient reports that she has a mental health history of PTSD, depression, bipolar disorder, and anxiety. The patient reports that she does not take any medications and is not active in therapy. The patient was accepting of  therapy resources on AVS. Th patient reports that she has only had SI during a teenager. The patient denies any current SI/HI/DV.   I reviewed information on post partum depression, sudden infant death syndrome, behavioral health resources, post partum community resources, and [facility] use while breast feeding.   I informed the patient about the law and hospital policy on mandated reporting on drug exposed children. The patient verbalized understanding.   The patient has made CPS report with [facility] with [place] DSS.   CSW Plan/Description:  Perinatal Mood and Anxiety Disorder (PMADs) Education, Sudden Infant Death Syndrome (SIDS) Education, Hospital Drug Screen Policy Information, CSW Will Continue to Monitor Umbilical Cord Tissue Drug Screen Results and Make Report if Warranted, Child Protective Service Report  , Other Information/Referral to [employer] Jacqueline Ruts, LCSW 10/13/2024, 12:46 PM  "

## 2024-10-13 NOTE — Anesthesia Postprocedure Evaluation (Signed)
"   Anesthesia Post Note  Patient: Jacqueline Whitehead  Procedure(s) Performed: CESAREAN DELIVERY  Patient location during evaluation: Mother Baby Anesthesia Type: Spinal Level of consciousness: awake and awake and alert Pain management: pain level controlled Vital Signs Assessment: post-procedure vital signs reviewed and stable Respiratory status: spontaneous breathing and nonlabored ventilation Cardiovascular status: blood pressure returned to baseline and stable Postop Assessment: no headache and no backache Anesthetic complications: no   There were no known notable events for this encounter.   Last Vitals:  Vitals:   10/13/24 0305 10/13/24 0400  BP: 119/76   Pulse: 67 71  Resp: 18   Temp: (!) 36.4 C   SpO2: 95% 94%    Last Pain:  Vitals:   10/13/24 0800  TempSrc:   PainSc: 0-No pain                 Nakhi Choi      "

## 2024-10-14 LAB — CBC
HCT: 29.4 % — ABNORMAL LOW (ref 36.0–46.0)
Hemoglobin: 9.4 g/dL — ABNORMAL LOW (ref 12.0–15.0)
MCH: 27.8 pg (ref 26.0–34.0)
MCHC: 32 g/dL (ref 30.0–36.0)
MCV: 87 fL (ref 80.0–100.0)
Platelets: 125 10*3/uL — ABNORMAL LOW (ref 150–400)
RBC: 3.38 MIL/uL — ABNORMAL LOW (ref 3.87–5.11)
RDW: 18.1 % — ABNORMAL HIGH (ref 11.5–15.5)
WBC: 6.6 10*3/uL (ref 4.0–10.5)
nRBC: 0 % (ref 0.0–0.2)

## 2024-10-14 LAB — COMPREHENSIVE METABOLIC PANEL WITH GFR
ALT: 13 U/L (ref 0–44)
AST: 26 U/L (ref 15–41)
Albumin: 3 g/dL — ABNORMAL LOW (ref 3.5–5.0)
Alkaline Phosphatase: 157 U/L — ABNORMAL HIGH (ref 38–126)
Anion gap: 10 (ref 5–15)
BUN: 9 mg/dL (ref 6–20)
CO2: 22 mmol/L (ref 22–32)
Calcium: 8.8 mg/dL — ABNORMAL LOW (ref 8.9–10.3)
Chloride: 108 mmol/L (ref 98–111)
Creatinine, Ser: 0.61 mg/dL (ref 0.44–1.00)
GFR, Estimated: >60 mL/min
Glucose, Bld: 76 mg/dL (ref 70–99)
Potassium: 3.7 mmol/L (ref 3.5–5.1)
Sodium: 140 mmol/L (ref 135–145)
Total Bilirubin: 0.3 mg/dL (ref 0.0–1.2)
Total Protein: 5.6 g/dL — ABNORMAL LOW (ref 6.5–8.1)

## 2024-10-14 MED ORDER — ACETAMINOPHEN 500 MG PO TABS
1000.0000 mg | ORAL_TABLET | Freq: Four times a day (QID) | ORAL | Status: DC
  Administered 2024-10-14 – 2024-10-15 (×4): 1000 mg via ORAL
  Filled 2024-10-14 (×4): qty 2

## 2024-10-14 NOTE — Progress Notes (Signed)
 "  Subjective:   Jacqueline Whitehead had a primary c/s for di/di twins on 10/12/34. Has had routine postpartum care.  Pt. Is eating, hydrating, and voiding regularly without difficulty. Has yet to have BM. She plans to breast and bottle feed. Has been pumping and getting up to 10ml of colostrum. One baby has latched but not her other baby, both have been able to take the bottle although have been sleepy. She met with lactation yesterday which was helpful for her. Reports small amount of vaginal bleeding, denies passing large blood clots. Has had cramping abdomen pain with pumping and incisional abd pain relieved with tylenol /ibuprofen , roxicodone  and onQ pump. Plans to use vasectomy for contraception. Denies anxiety/depression symptoms. Endorses good support from partner and family.   Jacqueline Whitehead had history of anemia during pregnancy. She received an IV Fe infusion yesterday. Hgb on admission was 10.6, yestreday was 8.5, recheck today was 9.4. She was also dx with preeclampsia without severe features on admission due to elevated UPC. She has not needed any medication to manage her blood pressure. Pressures have been in mild range, no other symptoms, LFTs now trending downward.   Objective:  Vital signs in last 24 hours: Temp:  [97.5 F (36.4 C)-98.4 F (36.9 C)] 98.2 F (36.8 C) (02/05 0820) Pulse Rate:  [62-79] 75 (02/05 0820) Resp:  [18-20] 20 (02/05 0820) BP: (94-145)/(69-93) 145/89 (02/05 0820) SpO2:  [95 %-99 %] 99 % (02/05 0820) Last BM Date : 10/12/24  General: NAD Pulmonary: no increased work of breathing Breasts: soft, non-tender, nipples without breakdown Abdomen: soft, non-tender Incision: LT incision with honeycomb dressing and OnQ pump intake, wound edges well approximated, no surrounding erythema Fundus: firm, midline, at umbilicus Lochia: light rubra, no clots Perineum: no erythema or foul odor discharge, minimal edema, laceration well approximated  Extremities: no edema, no  erythema, no tenderness  Results for orders placed or performed during the hospital encounter of 10/12/24 (from the past 72 hours)  CBC     Status: Abnormal   Collection Time: 10/12/24  5:10 AM  Result Value Ref Range   WBC 6.5 4.0 - 10.5 K/uL   RBC 3.79 (L) 3.87 - 5.11 MIL/uL   Hemoglobin 10.6 (L) 12.0 - 15.0 g/dL   HCT 67.5 (L) 63.9 - 53.9 %   MCV 85.5 80.0 - 100.0 fL   MCH 28.0 26.0 - 34.0 pg   MCHC 32.7 30.0 - 36.0 g/dL   RDW 81.8 (H) 88.4 - 84.4 %   Platelets 148 (L) 150 - 400 K/uL   nRBC 0.0 0.0 - 0.2 %    Comment: Performed at [facility], [address] [place], [place] [address]  RPR     Status: None   Collection Time: 10/12/24  5:10 AM  Result Value Ref Range   RPR Ser Ql NON REACTIVE NON REACTIVE    Comment: Performed at [facility], [address], [place], [place] [address]  Type and screen     Status: None   Collection Time: 10/12/24  5:10 AM  Result Value Ref Range   ABO/RH(D) AB POS    Antibody Screen NEG    Sample Expiration      10/15/2024,2359 Performed at [facility], [address] [place], [place] [address]   Comprehensive metabolic panel     Status: Abnormal   Collection Time: 10/12/24  6:17 AM  Result Value Ref Range   Sodium 138 135 - 145 mmol/L   Potassium 3.7  3.5 - 5.1 mmol/L   Chloride 107 98 - 111 mmol/L   CO2 18 (L) 22 - 32 mmol/L   Glucose, Bld 70 70 - 99 mg/dL    Comment: Glucose reference range applies only to samples taken after fasting for at least 8 hours.   BUN 7 6 - 20 mg/dL   Creatinine, Ser 9.37 0.44 - 1.00 mg/dL   Calcium 9.0 8.9 - 89.6 mg/dL   Total Protein 6.1 (L) 6.5 - 8.1 g/dL   Albumin 3.3 (L) 3.5 - 5.0 g/dL   AST 34 15 - 41 U/L   ALT 16 0 - 44 U/L   Alkaline Phosphatase 215 (H) 38 - 126 U/L   Total Bilirubin 0.8 0.0 - 1.2 mg/dL   GFR, Estimated >39 >39 mL/min    Comment: (NOTE) Calculated using the CKD-EPI Creatinine Equation (2021)    Anion gap 14 5 - 15    Comment: Performed at  [facility], [address] [place], [place] [address]  Protein / creatinine ratio, urine     Status: Abnormal   Collection Time: 10/12/24  7:27 AM  Result Value Ref Range   Creatinine, Urine 276 mg/dL    Comment: NO NORMAL RANGE ESTABLISHED FOR THIS TEST   Total Protein, Urine 190 mg/dL    Comment: NO NORMAL RANGE ESTABLISHED FOR THIS TEST   Protein Creatinine Ratio 0.7 (H) <0.2 mg/mg    Comment: Please note change in reference range. Performed at [facility], [address] [place], [place] [address]   Urine Drug Screen     Status: None   Collection Time: 10/12/24  7:27 AM  Result Value Ref Range   Opiates NEGATIVE NEGATIVE   Cocaine NEGATIVE NEGATIVE   Benzodiazepines NEGATIVE NEGATIVE   Amphetamines NEGATIVE NEGATIVE   Tetrahydrocannabinol NEGATIVE NEGATIVE   Barbiturates NEGATIVE NEGATIVE   Methadone Scn, Ur NEGATIVE NEGATIVE   Fentanyl  NEGATIVE NEGATIVE    Comment: (NOTE) Drug screen is for Medical Purposes only. Positive results are preliminary only. If confirmation is needed, notify lab within 5 days.  Drug Class                 Cutoff (ng/mL) Amphetamine and metabolites 1000 Barbiturate and metabolites 200 Benzodiazepine              200 Opiates and metabolites     300 Cocaine and metabolites     300 THC                         50 Fentanyl                     5 Methadone                   300  Trazodone is metabolized in vivo to several metabolites,  including pharmacologically active m-CPP, which is excreted in the  urine.  Immunoassay screens for amphetamines and MDMA have potential  cross-reactivity with these compounds and may provide false positive  result.  Performed at [facility], [address] [place], [place] [address]   ABO/Rh     Status: None   Collection Time: 10/12/24  7:42 AM  Result Value Ref Range   ABO/RH(D)      AB POS Performed at [facility], [address] [place], [place] [address]    CBC     Status: Abnormal   Collection Time: 10/13/24  5:57 AM  Result Value Ref Range   WBC 5.7 4.0 - 10.5 K/uL   RBC 3.01 (L) 3.87 - 5.11 MIL/uL   Hemoglobin 8.5 (L) 12.0 - 15.0 g/dL   HCT 73.1 (L) 63.9 - 53.9 %   MCV 89.0 80.0 - 100.0 fL   MCH 28.2 26.0 - 34.0 pg   MCHC 31.7 30.0 - 36.0 g/dL   RDW 82.1 (H) 88.4 - 84.4 %   Platelets 101 (L) 150 - 400 K/uL   nRBC 0.0 0.0 - 0.2 %    Comment: Performed at [facility], [address] [place], [place] [address]  CBC     Status: Abnormal   Collection Time: 10/14/24  6:53 AM  Result Value Ref Range   WBC 6.6 4.0 - 10.5 K/uL   RBC 3.38 (L) 3.87 - 5.11 MIL/uL   Hemoglobin 9.4 (L) 12.0 - 15.0 g/dL   HCT 70.5 (L) 63.9 - 53.9 %   MCV 87.0 80.0 - 100.0 fL   MCH 27.8 26.0 - 34.0 pg   MCHC 32.0 30.0 - 36.0 g/dL   RDW 81.8 (H) 88.4 - 84.4 %   Platelets 125 (L) 150 - 400 K/uL   nRBC 0.0 0.0 - 0.2 %    Comment: Performed at [facility], [address] [place], [place] [address]  Comprehensive metabolic panel with GFR     Status: Abnormal   Collection Time: 10/14/24  6:53 AM  Result Value Ref Range   Sodium 140 135 - 145 mmol/L   Potassium 3.7 3.5 - 5.1 mmol/L   Chloride 108 98 - 111 mmol/L   CO2 22 22 - 32 mmol/L   Glucose, Bld 76 70 - 99 mg/dL    Comment: Glucose reference range applies only to samples taken after fasting for at least 8 hours.   BUN 9 6 - 20 mg/dL   Creatinine, Ser 9.38 0.44 - 1.00 mg/dL   Calcium 8.8 (L) 8.9 - 10.3 mg/dL   Total Protein 5.6 (L) 6.5 - 8.1 g/dL   Albumin 3.0 (L) 3.5 - 5.0 g/dL   AST 26 15 - 41 U/L   ALT 13 0 - 44 U/L   Alkaline Phosphatase 157 (H) 38 - 126 U/L   Total Bilirubin 0.3 0.0 - 1.2 mg/dL   GFR, Estimated >39 >39 mL/min    Comment: (NOTE) Calculated using the CKD-EPI Creatinine Equation (2021)    Anion gap 10 5 - 15    Comment: Performed at [facility], [address] [place], [place] [address]    Assessment:   24 y.o. [phone] 2 day(s)  s/p primary  c/s for di/di twins Breastfeeding/bottle feeding Anemia secondary to acute blood loss- hemodynamically stable and symptomatic Preeclampsia without severe features, mild range blood pressures, no meds Pain well controlled  Plan:    IV Fe infusion given yesterday Blood Type --/--/AB POS Performed at [facility], [address] [place], [place] [address]  [phone]) / Dama 1.34 (08/11 1041) / Varicella Immune Rhogam not indicated Tdap/varicella/rubella to be offered before discharge if indicated Feeding plan breast & bottle, lactation support Encouraged to continue breastfeeding, BF education on latch, position changes, cluster feeding, hunger cues, lactogenesis II, milk supply Education given regarding options for contraception, as well as compatibility with breast feeding if applicable.  Patient plans on vasectomy for contraception. Continued routine postpartum care  Counseled on normal uterine involution and vaginal bleeding postpartum Anticipate discharge home tomorrow    Lolita Loots, FNP, CNM  [facility] 10/14/2024, 9:04 AM    "

## 2024-10-14 NOTE — Lactation Note (Signed)
 This note was copied from a baby's chart. Lactation Consultation Note  Patient Name: Jacqueline Whitehead Unijb'd Date: 10/14/2024 Age:25 hours Reason for consult: Follow-up assessment;Primapara;Other (Comment) (Discharge Education)   Maternal Data Follow up assessment w/ a parents and infants.  Mom pumping with her breastpump upon entry into the room.  She stated that she was very sleepy and just prefers doing 1 breast at a time right now.    Feeding Mother's Current Feeding Choice: Breast Milk and Formula Nipple Type: Slow - flow  Interventions Interventions: Breast feeding basics reviewed;Education;CDC milk storage guidelines  LC reviewed the importance of staying consistent w/ pumping to encourage milk supply to come in.  [facility] also discussed combo feeding and what this should look like, always offering breastmilk first and supplementing w/ the formula.  This is when mom stated oh we have been doing it backwards.  Discharge Discharge Education: Engorgement and breast care;Outpatient recommendation  Education on engorgement prevention/treatment was discussed as well as breastmilk storage guidelines.  LC provided patient with a handout on breastmilk storage guidelines from [facility]. [facility] outpatient lactation services phone number written on the white board in the room.  Patient verbalized understanding.  Consult Status Consult Status: Complete Follow-up type: Call as needed    Adon Gehlhausen S Jervon Ream 10/14/2024, 1:49 PM

## 2024-10-14 NOTE — TOC Progression Note (Signed)
 Transition of Care [facility]) - Progression Note    Patient Details  Name: Jacqueline Whitehead MRN: [id number] Date of Birth: 11-28-2000  Transition of [facility]) CM/SW Contact  K'La JINNY Ruts, LCSW Phone Number: 10/14/2024, 3:01 PM  Clinical Narrative:    Chart reviewed. CPS report was screened out.   ICM is signing off.                     Expected Discharge Plan and Services                                               Social Drivers of Health (SDOH) Interventions SDOH Screenings   Food Insecurity: No Food Insecurity (10/12/2024)  Housing: Unknown (10/12/2024)  Transportation Needs: No Transportation Needs (10/12/2024)  Utilities: Not At Risk (10/12/2024)  Alcohol Screen: Low Risk (03/17/2024)  Depression (PHQ2-9): Medium Risk (04/19/2024)  Financial Resource Strain: Low Risk (03/17/2024)  Physical Activity: Inactive (03/17/2024)  Social Connections: Socially Isolated (03/16/2024)  Stress: Stress Concern Present (03/17/2024)  Tobacco Use: Medium Risk (10/12/2024)  Health Literacy: Adequate Health Literacy (03/16/2024)    Readmission Risk Interventions     No data to display

## 2024-10-15 ENCOUNTER — Other Ambulatory Visit: Payer: Self-pay

## 2024-10-15 ENCOUNTER — Encounter: Payer: Self-pay | Admitting: Obstetrics

## 2024-10-15 MED ORDER — ACETAMINOPHEN 500 MG PO TABS
1000.0000 mg | ORAL_TABLET | Freq: Four times a day (QID) | ORAL | 0 refills | Status: AC
  Filled 2024-10-15: qty 30, 4d supply, fill #0

## 2024-10-15 MED ORDER — HEMORRHOIDAL HYGIENE 50 % EX PADS
1.0000 | MEDICATED_PAD | CUTANEOUS | 12 refills | Status: AC | PRN
  Filled 2024-10-15: qty 100, 10d supply, fill #0

## 2024-10-15 MED ORDER — SIMETHICONE 80 MG PO CHEW
80.0000 mg | CHEWABLE_TABLET | ORAL | 0 refills | Status: AC | PRN
  Filled 2024-10-15: qty 30, 30d supply, fill #0

## 2024-10-15 MED ORDER — ACETAMINOPHEN 500 MG PO TABS
1000.0000 mg | ORAL_TABLET | Freq: Four times a day (QID) | ORAL | Status: DC
  Administered 2024-10-15: 1000 mg via ORAL
  Filled 2024-10-15: qty 2

## 2024-10-15 MED ORDER — OXYCODONE HCL 5 MG PO TABS
5.0000 mg | ORAL_TABLET | ORAL | 0 refills | Status: AC | PRN
  Filled 2024-10-15: qty 5, 1d supply, fill #0

## 2024-10-15 MED ORDER — IBUPROFEN 800 MG PO TABS
800.0000 mg | ORAL_TABLET | Freq: Three times a day (TID) | ORAL | 0 refills | Status: AC
  Filled 2024-10-15: qty 30, 10d supply, fill #0

## 2024-10-15 NOTE — Discharge Summary (Signed)
 "    Postpartum Discharge Summary  Date of Service updated 10/15/24      Patient Name: Cana Mignano DOB: [birth year] MRN: [id number]  Date of admission: 10/12/2024 Delivery date: 10/12/2024   Buena, Boehm [facility] [[id number]]  10/12/2024    Cathi, Hazan [place] [[id number]]  10/12/2024 Delivering provider:    Electa, Sterry [[id number]]  MNAB, FPRPJ    Nicolas, Sisler [place] [[id number]]  LEIGH SOBER Date of discharge: 10/15/2024  Admitting diagnosis: Dichorionic diamniotic twin pregnancy in third trimester [O30.043] Intrauterine pregnancy: [redacted]w[redacted]d     Secondary diagnosis:  Principal Problem:   Dichorionic diamniotic twin pregnancy in third trimester Active Problems:   Iron  deficiency anemia during pregnancy   Gestational hypertension, third trimester   Delivery by elective cesarean section   Twin birth   Pre-eclampsia affecting childbirth  Additional problems: N/A    Discharge diagnosis: Term Pregnancy Delivered                                              Post partum procedures:IV iron  Augmentation: N/A Complications: None  Hospital course: Sceduled C/S   24 y.o. yo G1P1002 at [redacted]w[redacted]d was admitted to the hospital 10/12/2024 for scheduled cesarean section with the following indication:Elective Primary. Delivery details are as follows:  Membrane Rupture Time/Date:    Elleen, Coulibaly [facility] [[id number]]  8:41 AM    Lanecia, Sliva [place] [[id number]]  8:45 AM,   Danica, Camarena [place] [[id number]]  10/12/2024    Quaneshia, [place] [[id number]]  10/12/2024  Delivery Method:   Syble, [facility] [[id number]]  C-Section, Low Transverse    Jaclene, Bartelt [place] [[id number]]  C-Section, Low Transverse Operative Delivery:N/A Details of operation can be found in separate operative note.  Patient had a postpartum course complicated by none. She has a good support system at home, and is pumping well.  She is ambulating, tolerating a regular diet, passing flatus,  and urinating well. Patient is discharged home in stable condition on  10/15/24        Newborn Data: Birth date:   Hoa, Briggs [[id number]]  10/12/2024    Leolia, Vinzant [place] [[id number]]  10/12/2024 Birth time:   Kadience, Macchi [[id number]]  8:41 AM    Magaby, Rumberger [place] [[id number]]  8:42 AM Gender:   Kazuko, Clemence [[id number]]  Female    Nonna, Renninger [place] [[id number]]  Female Living status:   Loany, Neuroth [[id number]]  Opcpwh    Dwbizm, [place] [[id number]]  Living Apgars:   Kailin, Principato [place] [[id number]]  [address] [place] [[id number]]  8 ,   Teanna, Elem [place] [[id number]]  [address] [place] [[id number]]  9  Weight:   Nateisha, [place] [[id number]]  3180 g    Percilla, [place] [[id number]]  3810 g    Magnesium Sulfate received: No BMZ received: No Rhophylac:No MMR:N/A T-DaP:Given prenatally Flu: Given prenatally RSV Vaccine received: Yes Transfusion:No Immunizations administered: Immunization History  Administered Date(s) Administered    sv, Bivalent, Protein Subunit Rsvpref,pf [place]) 09/21/2024   Influenza, Seasonal, Injecte, Preservative Fre 07/26/2024   Influenza,inj,Quad PF,6+ Mos 07/02/2017   Tdap 04/28/2022, 08/09/2024    Recommend 6 weeks of prophylactic anticoagulation with LMWH or subcutaneous unfractionated heparin if 1 or more high risk factor is present.  Recommend 14 days of prophylactic anticoagulation with LMWH or subcutaneous unfractionated heparin  if 3 or more moderate risk factors are present.   Risk assessment for postpartum VTE and prophylactic treatment:  High risk factors: None Moderate risk factors: None, Multiple gestation , and Cesarean delivery   Postpartum VTE prophylaxis with LMWH not indicated   Physical exam  Vitals:   10/14/24 2127 10/14/24 2308 10/15/24 0326 10/15/24 0924  BP: 129/87 129/88 139/87 135/85  Pulse: 74 73  78 89  Resp:  20 18 19   Temp: 98.3 F (36.8 C) 98.2 F (36.8 C) 98.5 F (36.9 C) 98.6 F (37 C)  TempSrc:  Oral Oral Oral  SpO2: 99% 99% 98% 100%  Weight:      Height:       General: alert and cooperative Lochia: appropriate Uterine Fundus: firm Incision: Healing well with no significant drainage DVT Evaluation: No evidence of DVT seen on physical exam. Labs: Lab Results  Component Value Date   WBC 6.6 10/14/2024   HGB 9.4 (L) 10/14/2024   HCT 29.4 (L) 10/14/2024   MCV 87.0 10/14/2024   PLT 125 (L) 10/14/2024      Latest Ref Rng & Units 10/14/2024    6:53 AM  CMP  Glucose 70 - 99 mg/dL 76   BUN 6 - 20 mg/dL 9   Creatinine 9.55 - 8.99 mg/dL 9.38   Sodium 864 - 854 mmol/L 140   Potassium 3.5 - 5.1 mmol/L 3.7   Chloride 98 - 111 mmol/L 108   CO2 22 - 32 mmol/L 22   Calcium 8.9 - 10.3 mg/dL 8.8   Total Protein 6.5 - 8.1 g/dL 5.6   Total Bilirubin 0.0 - 1.2 mg/dL 0.3   Alkaline Phos 38 - 126 U/L 157   AST 15 - 41 U/L 26   ALT 0 - 44 U/L 13    Edinburgh Score:    10/13/2024    6:12 PM  Edinburgh Postnatal Depression Scale Screening Tool  I have been able to laugh and see the funny side of things. 0  I have looked forward with enjoyment to things. 0  I have blamed myself unnecessarily when things went wrong. 2  I have been anxious or worried for no good reason. 2  I have felt scared or panicky for no good reason. 1  Things have been getting on top of me. 1  I have been so unhappy that I have had difficulty sleeping. 1  I have felt sad or miserable. 1  I have been so unhappy that I have been crying. 0  The thought of harming myself has occurred to me. 0  Edinburgh Postnatal Depression Scale Total 8       After visit meds:  Allergies as of 10/15/2024       Reactions   Kiwi Extract Itching, Swelling   Throat swells but does not effect breathing (reaction to skin)   Bactrim  [sulfamethoxazole -trimethoprim ]    Peach [prunus Persica] Itching, Swelling   Throat  swells but does not effect breathing (reaction to skin)   Adhesive [tape] Rash   Please use paper tape        Medication List     STOP taking these medications    aspirin  EC 81 MG tablet       TAKE these medications    ACCRUFeR  30 MG Caps Generic drug: Ferric Maltol  Take 1 capsule (30 mg total) by mouth every other day. What changed: when to take this   acetaminophen  500 MG tablet Commonly known as: TYLENOL  Take 2 tablets (1,000  mg total) by mouth every 6 (six) hours.   Hemorrhoidal Hygiene 50 % Pads Apply 1 Application topically as needed.   ibuprofen  800 MG tablet Commonly known as: ADVIL  Take 1 tablet (800 mg total) by mouth 3 (three) times daily.   multivitamin-prenatal 27-0.8 MG Tabs tablet Take 1 tablet by mouth daily at 12 noon.   oxyCODONE  5 MG immediate release tablet Commonly known as: Oxy IR/ROXICODONE  Take 1 tablet (5 mg total) by mouth every 4 (four) hours as needed for moderate pain (pain score 4-6).   simethicone  80 MG chewable tablet Commonly known as: MYLICON Chew 1 tablet (80 mg total) by mouth as needed for flatulence.         Discharge home in stable condition Infant Feeding: Bottle and Breast Infant Disposition:home with mother Discharge instruction: per After Visit Summary and Postpartum booklet. Activity: Advance as tolerated. Pelvic rest for 6 weeks.  Diet: routine diet Anticipated Birth Control: Vasectomy Postpartum VTE prophyhlaxis: Not Indicated Postpartum Appointment:6 weeks Additional Postpartum F/U: Postpartum Depression checkup Future Appointments: Future Appointments  Date Time Provider Department Center  10/19/2024  3:35 PM Leigh Sober, MD [facility] None   Follow up Visit:  Follow-up Information     Leigh Sober, MD. Call in 1 week(s).   Specialties: Obstetrics, Gynecology Why: Postpartum follow up for incision check in 1 week Contact information: [address] [place] [address] [phone]          [facility] AT [place]. Call in 2 week(s).   Why: Two week postpartum telehealth visit; 6 week in person visit Contact information: [address] [facility] [place]  [phone]                    10/15/2024 Rosina Hamilton, Student-MidWife   "

## 2024-10-15 NOTE — Progress Notes (Signed)
 Patient discharged home with family. Discharge instructions, when to follow up, and prescriptions reviewed with patient. Patient verbalized understanding. Patient will be escorted out by auxiliary.

## 2024-10-15 NOTE — Progress Notes (Unsigned)
"  ° °  Postoperative Cesarean Incision Check Jacqueline Whitehead is a 24 y.o. [phone] s/p primary LTCS  at [redacted]w[redacted]d for twin pregnancy with preeclampsia without severe features, POD#7, here today for incision check.  Subjective: {PAIN CONTROL:13522::The patient is not having any pain.} She denies {Blank multiple:19196::fever,chills,nausea,vomiting}. Eating a regular diet {WITH-WITHOUT:5700} difficulty.  Is***not having regular bowel movements. Activity: {history; activity/diet:30389}. Bleeding is ***. She {Actions; denies/admits to:5300} issues with her incision.    Objective: There were no vitals taken for this visit. There is no height or weight on file to calculate BMI.  General:  alert and no distress  Abdomen: soft, bowel sounds active, non-tender  Incision:   {incision:13716::no dehiscence,incision well approximated,healing well,no drainage,no erythema,no hernia,no seroma,no swelling}    Assessment/Plan: Jacqueline Whitehead is a 24 y.o. [phone] s/p primary LTCS at [redacted]w[redacted]d for twin pregnancy and preeclampsia without severe features, POD#7, here today for incision check, healing well. No concerns with incision today, remaining steri-strips removed and replaced.  -Discussed at-home care, healing expectations, si/sx of infection.  -Call clinic if developing redness, discharge, or increasing pain. -Avoid vigorous scrubbing/washing of incision site; hygiene reviewed. -May trim back steri-strips if they peel; should fully remove after 1 week from today. -May resume driving and light walking. Still no heavy lifting >10-12 lbs and pelvic rest advised until cleared at 6wk postpartum visit.  -If stooling regularly x 1-2 weeks, can take stool softener every other day x 1 week, taper as tolerated.  No follow-ups on file.   Estil Mangle, DO [facility] [place] "

## 2024-10-15 NOTE — Addendum Note (Signed)
 Addended by: DAYNE SHERRY RAMAN on: 10/15/2024 08:21 AM   Modules accepted: Orders

## 2024-10-15 NOTE — Lactation Note (Signed)
 This note was copied from a baby's chart. Lactation Consultation Note  Patient Name: Jacqueline Whitehead Unijb'd Date: 10/15/2024 Age:25 hours Reason for consult: Follow-up assessment;Primapara   Maternal Data Follow up assessment w/ twin parents at 3 days postpartum.  Mom stated that she has been pumping of breastmilk.  She is concerned that she is not going to make enough.  She is pumping every 3hrs for 15 minutes.  Feeding Mother's Current Feeding Choice: Breast Milk and Formula   Lactation Tools Discussed/Used LC reminded parents to take the pumping parts for the hospital grade pump at home.    Interventions Interventions: 8 oz. bottles;Education;Ice  Mom stated that her breast were hard.  LC provided mom with ice packs for her breast.  Encouraged mom to use ice 15-60minutes per breast.    Reminded parents on the importance of washing pump parts and sterilizing parts once home.  Reviewed the milk storage guidelines with parents as well.  Consult Status Consult Status: Complete Follow-up type: Call as needed    Jacqueline Whitehead 10/15/2024, 11:38 AM

## 2024-10-19 ENCOUNTER — Encounter: Payer: MEDICAID | Admitting: Obstetrics
